# Patient Record
Sex: Male | Born: 1990 | Race: Black or African American | Hispanic: No | Marital: Single | Smoking: Never smoker
Health system: Southern US, Community
[De-identification: ages and names within clinical notes are randomized; demographics above are authoritative.]

## PROBLEM LIST (undated history)

## (undated) DIAGNOSIS — F319 Bipolar disorder, unspecified: Secondary | ICD-10-CM

## (undated) DIAGNOSIS — G4733 Obstructive sleep apnea (adult) (pediatric): Secondary | ICD-10-CM

## (undated) DIAGNOSIS — F259 Schizoaffective disorder, unspecified: Secondary | ICD-10-CM

## (undated) DIAGNOSIS — F250 Schizoaffective disorder, bipolar type: Secondary | ICD-10-CM

## (undated) HISTORY — DX: Obstructive sleep apnea (adult) (pediatric): G47.33

## (undated) HISTORY — PX: OTHER SURGICAL HISTORY: SHX169

---
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
## 2009-05-06 ENCOUNTER — Emergency Department (HOSPITAL_COMMUNITY): Admission: EM | Admit: 2009-05-06 | Discharge: 2009-05-06 | Payer: Self-pay | Admitting: Family Medicine

## 2012-08-21 ENCOUNTER — Encounter (HOSPITAL_COMMUNITY): Payer: Self-pay | Admitting: Emergency Medicine

## 2012-08-21 ENCOUNTER — Emergency Department (HOSPITAL_COMMUNITY)
Admission: EM | Admit: 2012-08-21 | Discharge: 2012-08-22 | Disposition: A | Discharge status: Home / Self Care | Payer: Medicaid Other | Attending: Emergency Medicine | Admitting: Emergency Medicine

## 2012-08-21 DIAGNOSIS — F319 Bipolar disorder, unspecified: Secondary | ICD-10-CM | POA: Insufficient documentation

## 2012-08-21 DIAGNOSIS — F209 Schizophrenia, unspecified: Secondary | ICD-10-CM

## 2012-08-21 DIAGNOSIS — Z046 Encounter for general psychiatric examination, requested by authority: Secondary | ICD-10-CM | POA: Insufficient documentation

## 2012-08-21 HISTORY — DX: Bipolar disorder, unspecified: F31.9

## 2012-08-21 LAB — COMPREHENSIVE METABOLIC PANEL
Alkaline Phosphatase: 77 U/L (ref 39–117)
BUN: 17 mg/dL (ref 6–23)
Chloride: 98 mEq/L (ref 96–112)
Creatinine, Ser: 0.91 mg/dL (ref 0.50–1.35)
GFR calc Af Amer: >90 mL/min (ref >90)
Glucose, Bld: 123 mg/dL — ABNORMAL HIGH (ref 70–99)
Potassium: 3.8 mEq/L (ref 3.5–5.1)
Total Bilirubin: 0.2 mg/dL — ABNORMAL LOW (ref 0.3–1.2)

## 2012-08-21 LAB — CBC
HCT: 45.4 % (ref 39.0–52.0)
Hemoglobin: 15.6 g/dL (ref 13.0–17.0)
MCHC: 34.4 g/dL (ref 30.0–36.0)
MCV: 83.6 fL (ref 78.0–100.0)
RDW: 12.9 % (ref 11.5–15.5)
WBC: 10.6 10*3/uL — ABNORMAL HIGH (ref 4.0–10.5)

## 2012-08-21 LAB — ETHANOL: Alcohol, Ethyl (B): <11 mg/dL (ref 0–11)

## 2012-08-21 MED ORDER — ZIPRASIDONE HCL 20 MG PO CAPS
20.0000 mg | ORAL_CAPSULE | Freq: Two times a day (BID) | ORAL | Status: DC
  Administered 2012-08-21 – 2012-08-22 (×3): 20 mg via ORAL
  Filled 2012-08-21 (×3): qty 1

## 2012-08-21 NOTE — ED Notes (Signed)
States hasn't been sleeping, feeling manic, not taking medicine for 1 day (per patient)- with 2 [employer] officers. Asking for GEODON- or something to sleep. Asked for Ambien, explained that is for night time. Speech, rapid, flight of ideas, cooperative, but says will only stay if GOEDON given, or something to help relax

## 2012-08-21 NOTE — ED Notes (Signed)
Pt to be moved to Psych ED Rm 38. Report called to Sheliah Mends ED RN. One personal belongings bag locked in [place] 38.

## 2012-08-21 NOTE — BH Assessment (Signed)
Assessment Note   Gerald Bridges is a 21 y.o. male  PRESENTING TO [facility] UNDER IVC. PT WAS IVC'D BY PSYCHIATRIST (Gerald Bridges), STATING PT HAS NOT BEEN COMPLIANT WITH MEDS, IS AGITATED, HOSTILE AND EXHIBITNG SEVERE ANXIETY.  PER PSYCHIATIST, [name] AND IS DELUSIONAL BUT DOES NOT ELABORATE ON SXS OF AVH.  PT DENIES SI/HI. THIS WRITER ATTEMPTED TO ASSESS PT, HOWEVER, PT CONTINUES TO EXHIBIT ANXIETY (UNABLE TO SIT DOWN DURING ASSESSMENT. PT TOLD THIS WRITER THAT HE DOESN'T KNOW WHY HE IS IN THE HOSPITAL OTHER THAN HIS MOTHER TELLING HIM HE HAD TO COME.  PT HAS SOME DIFFICULTY UNDERSTANDING QUESTIONS WHEN ASKED AND AT TIMES ANSWERS WITH UNINTELLIGIBLE SPEECH. PT MAY HAVE SOME COGNITIVE DISABILITIES, BUT THIS HAS NOT BEEN CONFIRMED BY THIS WRITER AS MOTHER IS UNREACHABLE(LINE IS BUSY) AT THIS TIME TO COLLABORATE STATEMENTS MADE BY PSYCHISTRIST.  PT WAS ABLE TO TELL THIS WRITER THAT HE HAS HAD INPT ADMISSIONS WITH [facility], [facility] AND [facility]. THE INFORAMTION NOTED HERE IS COLLATERAL, AS PT IS POOR HISTORIAN. [facility] REQUESTED TO COMPLETE FINAL DISPOSITION.    Axis I: Mood Disorder NOS Axis II: Deferred Axis III:  Past Medical History  Diagnosis Date  . Bipolar disorder    Axis IV: other psychosocial or environmental problems and problems related to social environment Axis V: 41-50 serious symptoms  Past Medical History:  Past Medical History  Diagnosis Date  . Bipolar disorder     History reviewed. No pertinent past surgical history.  Family History: No family history on file.  Social History:  reports that he has never smoked. He does not have any smokeless tobacco history on file. He reports that he does not drink alcohol or use illicit drugs.  Additional Social History:  Alcohol / Drug Use Pain Medications: None  Prescriptions: None  Over the Counter: None  History of alcohol / drug use?: No history of alcohol / drug abuse Longest  period of sobriety (when/how long): None   CIWA: CIWA-Ar BP: 126/70 mmHg Pulse Rate: 88  COWS:    Allergies: Not on File  Home Medications:  (Not in a hospital admission)  OB/GYN Status:  No LMP for male patient.  General Assessment Data [facility] of Assessment: [facility] Living Arrangements: Parent (Lives with mother ) Can pt return to current living arrangement?: Yes Admission Status: Involuntary Is patient capable of signing voluntary admission?: No Transfer from: Acute Hospital Referral Source: MD  Education Status Is patient currently in school?: No (Unk ) Current Grade: None  Highest grade of school patient has completed: None  Name of school: None  Contact person: None   Risk to self Suicidal Ideation: No Suicidal Intent: No Is patient at risk for suicide?: No Suicidal Plan?: No Access to Means: No What has been your use of drugs/alcohol within the last 12 months?: Pt denies  Previous Attempts/Gestures: No How many times?: 0  Other Self Harm Risks: None  Triggers for Past Attempts: None known Intentional Self Injurious Behavior: None Family Suicide History: No Recent stressful life event(s): Other (Comment) (Non compliant with meds ) Persecutory voices/beliefs?: No Depression: No Depression Symptoms:  (No depression sxs present ) Substance abuse history and/or treatment for substance abuse?: No Suicide prevention information given to non-admitted patients: Not applicable  Risk to Others Homicidal Ideation: No Thoughts of Harm to Others: No Current Homicidal Intent: No Current Homicidal Plan: No Access to Homicidal Means: No Identified Victim: None  History of  harm to others?: No Assessment of Violence: None Noted Violent Behavior Description: None  Does patient have access to weapons?: No Criminal Charges Pending?: No Does patient have a court date: No  Psychosis Hallucinations: None noted Delusions: None noted  Mental Status  Report Appear/Hygiene: Other (Comment) (Appropriate ) Eye Contact: Fair Motor Activity: Unremarkable Speech: Incoherent;Slurred Level of Consciousness: Drowsy Mood: Anxious;Irritable Affect: Anxious;Irritable Anxiety Level: Minimal Thought Processes: Irrelevant Judgement: Impaired Orientation: Person;Place;Time;Situation Obsessive Compulsive Thoughts/Behaviors: None  Cognitive Functioning Concentration: Decreased Memory: Recent Intact;Remote Intact IQ:  (Unk ) Insight: Fair Impulse Control: Fair Appetite: Good Weight Loss: 0  Weight Gain: 0  Sleep: Decreased Total Hours of Sleep:  (Intermittent ) Vegetative Symptoms: None  ADLScreening [facility] Assessment Services) Patient's cognitive ability adequate to safely complete daily activities?: Yes Patient able to express need for assistance with ADLs?: Yes Independently performs ADLs?: Yes (appropriate for developmental age)  Abuse/Neglect [facility]) Physical Abuse: Denies Verbal Abuse: Denies Sexual Abuse: Denies  Prior Inpatient Therapy Prior Inpatient Therapy: Yes Prior Therapy Dates: Unk  Prior Therapy Facilty/Provider(s): [address], [facility], [address]  Reason for Treatment: Mood D/O   Prior Outpatient Therapy Prior Outpatient Therapy: Yes Prior Therapy Dates: Current  Prior Therapy Facilty/Provider(s): [facility]  Reason for Treatment: Med Mgt   ADL Screening (condition at time of admission) Patient's cognitive ability adequate to safely complete daily activities?: Yes Patient able to express need for assistance with ADLs?: Yes Independently performs ADLs?: Yes (appropriate for developmental age) Weakness of Legs: None Weakness of Arms/Hands: None  Home Assistive Devices/Equipment Home Assistive Devices/Equipment: None  Therapy Consults (therapy consults require a physician order) PT Evaluation Needed: No OT Evalulation Needed: No SLP Evaluation Needed: No Abuse/Neglect Assessment (Assessment to be  complete while patient is alone) Physical Abuse: Denies Verbal Abuse: Denies Sexual Abuse: Denies Exploitation of patient/patient's resources: Denies Self-Neglect: Denies Values / Beliefs Cultural Requests During Hospitalization: None Spiritual Requests During Hospitalization: None Consults Spiritual Care Consult Needed: No Social Work Consult Needed: No Merchant navy officer (For Healthcare) Advance Directive: Patient does not have advance directive;Patient would not like information Pre-existing out of facility DNR order (yellow form or pink MOST form): No Nutrition Screen- [facility] Patient's home diet: Regular Have you recently lost weight without trying?: No Have you been eating poorly because of a decreased appetite?: No Malnutrition Screening Tool Score: 0   Additional Information 1:1 In Past 12 Months?: No CIRT Risk: No Elopement Risk: No Does patient have medical clearance?: Yes     Disposition:  Disposition Disposition of Patient: Referred to (Telepsych for final disposition ) Patient referred to: Other (Comment) (Telepsych for final disposition )  On Site Evaluation by:   Reviewed with Physician:     Murrell Redden 08/21/2012 10:25 PM

## 2012-08-21 NOTE — ED Notes (Signed)
Pt wanded and belonging went through by security. Items placed in locker number 2

## 2012-08-21 NOTE — ED Provider Notes (Signed)
History     CSN: [id number]  Arrival date & time 08/21/12  1028   First MD Initiated Contact with Patient 08/21/12 1044      Chief Complaint  Patient presents with  . IVC med clearance     (Consider location/radiation/quality/duration/timing/severity/associated sxs/prior treatment) HPI The patient presents under involuntary commitment.  The patient has a history of schizophrenia, according to patient's mother he has not been taking his medication, hasn't exhibited increasingly uncontrollable behavior, responding to external stimuli over the past days. The patient himself states that he is fine, denies any focal complaints.  He states that he has been unable take his medication, do to altered sleep-wake cycle.  She denies auditory, visual hallucinations.  He also denies any suicidal or homicidal ideation. Patient's mother spoke with the patient's psychiatrist prior to arrival.  In concordance IVC papers were taken out on the patient. Past Medical History  Diagnosis Date  . Bipolar disorder     History reviewed. No pertinent past surgical history.  No family history on file.  History  Substance Use Topics  . Smoking status: Never Smoker   . Smokeless tobacco: Not on file  . Alcohol Use: No      Review of Systems  Unable to perform ROS: Psychiatric disorder    Allergies  Review of patient's allergies indicates not on file.  Home Medications  No current outpatient prescriptions on file.  BP 118/77  Pulse 120  Temp 97.8 F (36.6 C) (Oral)  Resp 16  SpO2 98%  Physical Exam  Nursing note and vitals reviewed. Constitutional: He is oriented to person, place, and time. He appears well-developed. No distress.  HENT:  Head: Normocephalic and atraumatic.  Eyes: Conjunctivae normal and EOM are normal.  Cardiovascular: Normal rate and regular rhythm.   Pulmonary/Chest: Effort normal. No stridor. No respiratory distress.  Abdominal: He exhibits no distension.    Musculoskeletal: He exhibits no edema.  Neurological: He is alert and oriented to person, place, and time.  Skin: Skin is warm and dry.  Psychiatric: His mood appears anxious. His speech is rapid and/or pressured. He is is hyperactive. He is not aggressive and not combative. Thought content is delusional. Thought content is not paranoid. Cognition and memory are impaired. He expresses impulsivity. He expresses no homicidal and no suicidal ideation. He expresses no suicidal plans and no homicidal plans. He is attentive.    ED Course  Procedures (including critical care time)  Labs Reviewed  CBC - Abnormal; Notable for the following:    WBC 10.6 (*)     All other components within normal limits  URINE RAPID DRUG SCREEN (HOSP PERFORMED)  COMPREHENSIVE METABOLIC PANEL  ETHANOL  URINALYSIS, ROUTINE W REFLEX MICROSCOPIC  ACETAMINOPHEN LEVEL  SALICYLATE LEVEL   No results found.   No diagnosis found.  Patient requests Geodon due to his anxiety  MDM  This young male with history of schizophrenia now presents under involuntary commitment papers taken out by his mother, in concordance with his psychiatrist, do to medication noncompliance and increasingly anxious behavior.  On my exam the patient is fidgety, but in no distress.  He is not obviously responding to external stimuli, though he is delusional, has little insight into his condition.  He is medically clear for psychiatric evaluation.      Gerhard Munch, MD 08/21/12 [phone]

## 2012-08-21 NOTE — ED Notes (Signed)
Notifed EDP, pt. Requesting Geodon, order in for 1700.  EDP gave verbal order to give Geodon, 20mg , PO now and again at 1700.

## 2012-08-22 LAB — URINALYSIS, ROUTINE W REFLEX MICROSCOPIC
Bilirubin Urine: NEGATIVE
Ketones, ur: NEGATIVE mg/dL
Leukocytes, UA: NEGATIVE
Nitrite: NEGATIVE
Protein, ur: NEGATIVE mg/dL
Urobilinogen, UA: 0.2 mg/dL (ref 0.0–1.0)

## 2012-08-22 NOTE — ED Notes (Signed)
Pt. Continues to wait for Mom to take him home.

## 2012-08-22 NOTE — BHH Counselor (Signed)
Contacted patient's mother and she agreed to pick patient up from the ED.

## 2012-08-22 NOTE — Discharge Instructions (Signed)
 Schizophrenia Schizophrenia is a serious mental illness. There is disturbed and disorganized thinking, language, and behavior. Patients may see, hear, or feel things that are not really there. Sometimes speech is incoherent and there are multiple problems with day to day living. Schizophrenia should not be confused with multiple personality disorder (now called dissociative identity disorder) in which a person has at least two distinct personalities. About 1% of people have schizophrenia in their lifetimes. It affects men and women equally. CAUSES  There are many theories about the cause of schizophrenia. The genes a person inherits from his or her parents may be partially responsible. Stress in a person's environment can trigger episodes. A person may have functioned normally for years and then have an acute (sudden) psychotic episode caused by stress. Some scientists believe that something might happen before birth, such as a viral infection in the womb, that causes schizophrenic symptoms (problems) decades later. Special scans, such as PET (positron-emission tomography) have been used to look at the brains of people with this illness. These pictures show that some parts of the brain seem to have metabolic or chemical abnormalities. Lab studies have shown that nerve cells in some parts of the brains of schizophrenics may be uneven or damaged. Another possible cause is that chemicals carrying signals between nerve cells may not be working. Schizophrenia does not appear to be caused by family problems. Stress does appear to make things worse for people with this illness. SYMPTOMS No single symptom defines this condition. Important signs are:  Hallucinations (hearing voices or seeing things that are not there to hear or see).  Dressing inappropriately.  Neglecting personal hygiene and grooming.  Withdrawing from social contacts and not speaking to anybody (autism).  Inability to understand what a  schizophrenic is saying.  A growing distrust of people without good cause.  Being very bland or blunted emotionally (flat affect). TYPES OF SCHIZOPHRENIA  Paranoid schizophrenia involves delusional thoughts. The patient believes people around them are against them and plotting against them.  In grandiose schizophrenia the patient may feel that he is God, or the President, etc.  Disorganized schizophrenia involves symptoms of disorganized speech. TREATMENT  Medications are the most important part of the treatment of schizophrenia. Many medications are available that can relieve symptoms. It is often helpful if these can be administered by a more trusted family member because the patient may sometimes think they are being poisoned. It is important that the medications be given regularly even when the patient seems to be doing well. Do not stop giving medications without instruction by a caregiver. This could lead to a relapse. Hospitalization may sometimes be necessary if symptoms cannot be controlled with medications. Schizophrenia may be lifelong. However, periods of illness may be inter spaced with long periods of normality. Medications can greatly improve the quality of life. Non prescribed drugs and alcohol should be avoided.  Assistance is available for care. The [employer] is an organization of family members of people with severe mental illness. They direct families and patients to support groups, education, and advocacy programs for additional help. [employer]) toll-free help line number is 800/950-NAMI, or 765 632 7249. Document Released: 11/02/2000 Document Revised: 01/28/2012 Document Reviewed: 11/05/2005 [facility] Patient Information 2013 [place], [place].     Please take and comply with your current medications.

## 2012-08-22 NOTE — ED Provider Notes (Signed)
PT has had telepsych consult by Dr Jacky Kindle, who feels patient is now cooperative, but has been non-compliant with his medications. He feels patient can be discharged and has rescinded his IVC papers. He suggests keeping on his prior meds and to give geodon 20 mg IM/PO BID prn agitation. Psychosis. Feels he can be discharged in am.    Devoria Albe, MD, Franz Dell, MD 08/22/12 [phone]

## 2012-08-22 NOTE — BHH Counselor (Signed)
Patient completed a telepsych and Dr. Cathie Beams recommended discharge home with community referrals. Writer provided patient with the appropriate referrals to Mental Health, [facility], [facility] agencies, and other community psychiatrist.

## 2012-08-22 NOTE — ED Notes (Signed)
ACT team informed us that pt.'s Mother is on her way to get her son.

## 2014-02-18 DIAGNOSIS — B350 Tinea barbae and tinea capitis: Secondary | ICD-10-CM | POA: Insufficient documentation

## 2015-05-03 ENCOUNTER — Encounter: Payer: Self-pay | Admitting: *Deleted

## 2015-05-03 ENCOUNTER — Encounter: Payer: Medicaid Other | Attending: Family Medicine | Admitting: *Deleted

## 2015-05-03 DIAGNOSIS — Z713 Dietary counseling and surveillance: Secondary | ICD-10-CM | POA: Insufficient documentation

## 2015-05-03 DIAGNOSIS — E669 Obesity, unspecified: Secondary | ICD-10-CM | POA: Insufficient documentation

## 2015-05-03 NOTE — Progress Notes (Signed)
  Medical Nutrition Therapy:  Appt start time: 0900 end time:  0930.  Assessment:  Primary concerns today: Gerald Bridges is here with his mom for nutrition counseling.  Patient has schizoaffective disorder and lives with his mom.  Mom reports requesting this appointment as she is concerned about his physical health and nutrition.  She feels she is a healthy eater and tries to have healthier food choices in the home, but he won't eat them.  She says he drinks soda all the time and loves energy-dense foods. She thinks he has an eating disorder because he eats all the time and she has tried multiple different ways to get him to make changes.    Mom does the grocery shopping and the cooking, but Gerald Bridges gets an allowance and buys his own sodas and snacks.  Mom has diabetes so she doesn't fry foods anymore.  Mom reports that Gerald Bridges only wants the meat, not the vegetables.  He also drinks several liters of soda and she switched him to juice. When at home he eats in his bed without distractions.  He is a fast eater.  They do not eat out often. He loves fried chicken and macaroni, hamburger, fries, cakes, soda.   Preferred Learning Style:  No preference indicated   Learning Readiness:  Contemplating- Gerald Bridges  Ready- mom  MEDICATIONS: see list   DIETARY INTAKE:  Usual eating pattern includes 1 meals and 0 snacks per day. Per Gerald Bridges.  Mom reports that he eats constantly, all day and is constantly hungry  Everyday foods include proteins, sugary beverages, and starches.  Avoided foods include vegetables.    24-hr recall:  B ( AM): grits, eggs, sausage  D ( PM): chinese food Beverages: pepsi, sprite, fruit lunch, juice  Usual physical activity: none.   [facility] but [facility] in [facility]  Estimated energy needs: 2500-2800 calories    Nutritional Diagnosis:  NB-1.7 Undesireable food choices As related to excessive consumption of sugary beverages and energy dense snacks, as well as limited  consumption of fruits orvegtables.  As evidenced by dietary recall.    Intervention:  Nutrition counseling provided.  Briefly discussed Gerald Bridges's risk for diabetes due to family history.  Explained that diabetes was preventable, or at least delayable, if he makes some changes.  He responded he was interested in making changes.  Recommended decreasing sodas and increasing water.  He drinks 2 2-liter Pepsi's/day.  He agreed to drink 1/day instead.  Discussed family meals and he agreed to eat at the table in the kitchen with his mom.  Asked if he could taste the vegetables mom prepares and he agreed to try vegetables.  Discussed eating more slowly, aim to make meals last 20 minutes in order to register satiety. Used MyPlate to show what healthy eating looks like.  Asked mom to limit the funds he is allowed for buying snacks and to please now buy as much herself  Teaching Method Utilized:  Visual Auditory   Barriers to learning/adherence to lifestyle change: willingness to change  Demonstrated degree of understanding via:  Teach Back   Monitoring/Evaluation:  Dietary intake, exercise, and body weight in 1 month(s).

## 2015-05-03 NOTE — Patient Instructions (Signed)
Eat at the table with mom in the kitchen Cut back on soda, increase water Taste the vegetables please Eat more slowly, try to make meal last 20 minutes

## 2015-06-09 ENCOUNTER — Ambulatory Visit: Payer: Medicaid Other | Admitting: *Deleted

## 2015-09-02 ENCOUNTER — Other Ambulatory Visit: Payer: Self-pay | Admitting: Family

## 2015-09-02 DIAGNOSIS — M7989 Other specified soft tissue disorders: Secondary | ICD-10-CM

## 2015-09-06 ENCOUNTER — Ambulatory Visit
Admission: RE | Admit: 2015-09-06 | Discharge: 2015-09-06 | Disposition: A | Discharge status: Home / Self Care | Payer: Medicaid Other | Source: Ambulatory Visit | Attending: Family | Admitting: Family

## 2015-09-06 DIAGNOSIS — M7989 Other specified soft tissue disorders: Secondary | ICD-10-CM

## 2015-10-09 ENCOUNTER — Encounter (HOSPITAL_COMMUNITY): Payer: Self-pay | Admitting: *Deleted

## 2015-10-09 ENCOUNTER — Emergency Department (HOSPITAL_COMMUNITY)
Admission: EM | Admit: 2015-10-09 | Discharge: 2015-10-11 | Disposition: A | Discharge status: Home / Self Care | Payer: Medicaid Other | Attending: Emergency Medicine | Admitting: Emergency Medicine

## 2015-10-09 DIAGNOSIS — F259 Schizoaffective disorder, unspecified: Secondary | ICD-10-CM | POA: Diagnosis not present

## 2015-10-09 DIAGNOSIS — R4789 Other speech disturbances: Secondary | ICD-10-CM | POA: Diagnosis not present

## 2015-10-09 DIAGNOSIS — F250 Schizoaffective disorder, bipolar type: Secondary | ICD-10-CM | POA: Diagnosis not present

## 2015-10-09 DIAGNOSIS — Z8659 Personal history of other mental and behavioral disorders: Secondary | ICD-10-CM | POA: Diagnosis not present

## 2015-10-09 DIAGNOSIS — K029 Dental caries, unspecified: Secondary | ICD-10-CM | POA: Diagnosis not present

## 2015-10-09 DIAGNOSIS — R531 Weakness: Secondary | ICD-10-CM | POA: Diagnosis present

## 2015-10-09 DIAGNOSIS — Z79899 Other long term (current) drug therapy: Secondary | ICD-10-CM | POA: Insufficient documentation

## 2015-10-09 DIAGNOSIS — F251 Schizoaffective disorder, depressive type: Secondary | ICD-10-CM | POA: Diagnosis not present

## 2015-10-09 DIAGNOSIS — R479 Unspecified speech disturbances: Secondary | ICD-10-CM

## 2015-10-09 HISTORY — DX: Schizoaffective disorder, bipolar type: F25.0

## 2015-10-09 HISTORY — DX: Schizoaffective disorder, unspecified: F25.9

## 2015-10-09 LAB — URINALYSIS, ROUTINE W REFLEX MICROSCOPIC
Bilirubin Urine: NEGATIVE
Glucose, UA: NEGATIVE mg/dL
Hgb urine dipstick: NEGATIVE
Ketones, ur: NEGATIVE mg/dL
LEUKOCYTES UA: NEGATIVE
NITRITE: NEGATIVE
PROTEIN: NEGATIVE mg/dL
Specific Gravity, Urine: 1.021 (ref 1.005–1.030)
pH: 5.5 (ref 5.0–8.0)

## 2015-10-09 MED ORDER — DIPHENHYDRAMINE HCL 25 MG PO CAPS
25.0000 mg | ORAL_CAPSULE | Freq: Once | ORAL | Status: AC
  Administered 2015-10-09: 25 mg via ORAL
  Filled 2015-10-09: qty 1

## 2015-10-09 NOTE — ED Provider Notes (Signed)
CSN: [id number]     Arrival date & time 10/09/15  2220 History   First MD Initiated Contact with Patient 10/09/15 2240     Chief Complaint  Patient presents with  . Weakness  . Aphasia     (Consider location/radiation/quality/duration/timing/severity/associated sxs/prior Treatment) HPI   Gerald Bridges is a 24 y.o. male, with a history of Bipolar and Schizoaffective Schizophrenia, presenting to the ED with mumbling speech. Last seen normal was one week ago. Only medication change was the addition of PO Haldol about a week ago. Patient's mother states, "I think he may have had a stroke." Pt denies pain, fever/chills, N/V, dizziness, recent illness, or any other complaints. Pt [facility] to [facility] his prescriptions. Most of the history of present illness was provided by patient's mother and other family at the bedside. Patient's mother states, "something's changed about his voice I don't usually have to strain to hear him or understand him." Accounts vary as to when the symptoms began because mother states that his last seen normal was 2 days ago, however other family members state the patient hasn't been normal for a week.   Past Medical History  Diagnosis Date  . Bipolar disorder (HCC)   . Schizo affective schizophrenia (HCC)    History reviewed. No pertinent past surgical history. Family History  Problem Relation Age of Onset  . Diabetes Mother    Social History  Substance Use Topics  . Smoking status: Never Smoker   . Smokeless tobacco: None  . Alcohol Use: No    Review of Systems  Neurological: Positive for speech difficulty.  All other systems reviewed and are negative.     Allergies  Dexmethylphenidate hcl and Methylphenidate derivatives  Home Medications   Prior to Admission medications   Medication Sig Start Date End Date Taking? Authorizing Provider  benztropine (COGENTIN) 2 MG tablet Take 2 mg by mouth 2 (two) times daily.   Yes Historical Provider, MD   haloperidol (HALDOL) 1 MG tablet Take 1 mg by mouth 2 (two) times daily.   Yes Historical Provider, MD  haloperidol decanoate (HALDOL DECANOATE) 100 MG/ML injection Inject 100 mg into the muscle every 28 (twenty-eight) days.   Yes Historical Provider, MD   BP 128/91 mmHg  Pulse 95  Temp(Src) 97.5 F (36.4 C) (Oral)  Resp 19  SpO2 98% Physical Exam  Constitutional: He is oriented to person, place, and time. He appears well-developed and well-nourished. No distress.  HENT:  Head: Normocephalic and atraumatic.  Speech seems as though pt is mumbling. Dental caries noted in right lower pre-molars. Tongue does not appear swollen. Oropharynx appears dry. Upon inspection only the patient's hard palate and part of the soft palate is visible.  Eyes: Conjunctivae are normal. Pupils are equal, round, and reactive to light.  Neck: Normal range of motion. Neck supple.  Cardiovascular: Normal rate, regular rhythm and normal heart sounds.   Pulmonary/Chest: Effort normal and breath sounds normal. No respiratory distress.  Abdominal: Soft. Bowel sounds are normal.  Musculoskeletal: Normal range of motion. He exhibits no edema or tenderness.  Lymphadenopathy:    He has no cervical adenopathy.  Neurological: He is alert and oriented to person, place, and time. He has normal reflexes.  No sensory deficits. Strength 5/5 in all extremities. Shuffling gait, which family states has been going on for weeks. Cranial nerves II-XII grossly intact. Grip strengths equal. No arm drift. No facial droop.   Skin: Skin is warm and dry. He is not diaphoretic.  Psychiatric:  Affect is flattened.   Nursing note and vitals reviewed.   ED Course  Procedures (including critical care time) Labs Review Labs Reviewed  CBC WITH DIFFERENTIAL/PLATELET - Abnormal; Notable for the following:    WBC 12.9 (*)    Neutro Abs 9.0 (*)    Monocytes Absolute 1.6 (*)    All other components within normal limits  COMPREHENSIVE  METABOLIC PANEL - Abnormal; Notable for the following:    Sodium 133 (*)    Glucose, Bld 108 (*)    Total Protein 8.5 (*)    ALT 16 (*)    All other components within normal limits  URINALYSIS, ROUTINE W REFLEX MICROSCOPIC (NOT AT [facility]) - Abnormal; Notable for the following:    APPearance CLOUDY (*)    All other components within normal limits  URINE RAPID DRUG SCREEN, HOSP PERFORMED    Imaging Review Ct Head Wo Contrast  10/10/2015  CLINICAL DATA:  Acute onset of slurred speech and abnormal gait. Initial encounter. EXAM: CT HEAD WITHOUT CONTRAST TECHNIQUE: Contiguous axial images were obtained from the base of the skull through the vertex without intravenous contrast. COMPARISON:  None. FINDINGS: There is no evidence of acute infarction, mass lesion, or intra- or extra-axial hemorrhage on CT. The posterior fossa, including the cerebellum, brainstem and fourth ventricle, is within normal limits. The third and lateral ventricles, and basal ganglia are unremarkable in appearance. The cerebral hemispheres are symmetric in appearance, with normal gray-white differentiation. No mass effect or midline shift is seen. There is no evidence of fracture; there is incomplete fusion of the posterior arch of C1. The orbits are within normal limits. The paranasal sinuses and mastoid air cells are well-aerated. No significant soft tissue abnormalities are seen. IMPRESSION: Unremarkable noncontrast CT of the head. Electronically Signed   By: Roanna RaiderJeffery  Chang M.D.   On: 10/10/2015 00:25   I have personally reviewed and evaluated these images and lab results as part of my medical decision-making.   EKG Interpretation None      MDM   Final diagnoses:  Speech abnormality  Dental caries    Gerald Bridges presents with changes in speech.  Findings and plan of care discussed with Geoffery Lyonsouglas Delo, MD.  Suspect that patient has dry mouth from the Haldol combined with discomfort from dental caries. This patient  does not present as if he's had a stroke. 12:04 AM reassessed patient. No change in patient speech pattern. No additional symptoms or complaints. Head CT reveals no intracranial hemorrhage or other abnormalities. Suspect this patient may need an adjustment in his medications. Will consult TTS to evaluate if patient needs to be admitted for medication adjustment. This plan of care as well as the CT scan and lab results were communicated with the patient and his family, who agreed with the plan and voice understanding of the CT scan and lab results. 12:43 AM spoke with TTS and gave them a rundown of this patient. They confirmed that they would evaluate this patient. 12:49 AM End of shift patient care handoff report given to Earley FavorGail Schulz, NP Filed Vitals:   10/09/15 2226 10/09/15 2300 10/10/15 0000  BP: 126/86 130/91 128/91  Pulse: 94 100 95  Temp: 97.5 F (36.4 C)    TempSrc: Oral    Resp: 18 15 19   SpO2: 97% 99% 98%    Anselm PancoastShawn C Nayelis Bonito, PA-C 10/10/15 0050  Geoffery Lyonsouglas Delo, MD 10/10/15 [id number]  Geoffery Lyonsouglas Delo, MD 10/10/15 [id number]

## 2015-10-09 NOTE — ED Notes (Signed)
Family states "I think he has had a stroke"; family reports gait is off and pt has generalized weakness; family reports slurred speech and trouble getting words out; normal facial movement and expression; no facial droop noted; pt ambulatory to triage with a shortened gait; pt c/o of feeling short of breath; no increase work of breathing or accessory muscle use noted

## 2015-10-10 ENCOUNTER — Emergency Department (HOSPITAL_COMMUNITY): Payer: Medicaid Other

## 2015-10-10 DIAGNOSIS — F259 Schizoaffective disorder, unspecified: Secondary | ICD-10-CM | POA: Diagnosis not present

## 2015-10-10 LAB — COMPREHENSIVE METABOLIC PANEL
ALK PHOS: 72 U/L (ref 38–126)
ALT: 16 U/L — AB (ref 17–63)
AST: 19 U/L (ref 15–41)
Albumin: 4.4 g/dL (ref 3.5–5.0)
Anion gap: 7 (ref 5–15)
BUN: 13 mg/dL (ref 6–20)
CALCIUM: 9.5 mg/dL (ref 8.9–10.3)
CO2: 24 mmol/L (ref 22–32)
CREATININE: 0.87 mg/dL (ref 0.61–1.24)
Chloride: 102 mmol/L (ref 101–111)
Glucose, Bld: 108 mg/dL — ABNORMAL HIGH (ref 65–99)
Potassium: 3.8 mmol/L (ref 3.5–5.1)
Sodium: 133 mmol/L — ABNORMAL LOW (ref 135–145)
Total Bilirubin: 0.3 mg/dL (ref 0.3–1.2)
Total Protein: 8.5 g/dL — ABNORMAL HIGH (ref 6.5–8.1)

## 2015-10-10 LAB — CBC WITH DIFFERENTIAL/PLATELET
Basophils Absolute: 0 10*3/uL (ref 0.0–0.1)
Basophils Relative: 0 %
Eosinophils Absolute: 0.1 10*3/uL (ref 0.0–0.7)
Eosinophils Relative: 1 %
HCT: 39.3 % (ref 39.0–52.0)
HEMOGLOBIN: 13.3 g/dL (ref 13.0–17.0)
LYMPHS ABS: 2.2 10*3/uL (ref 0.7–4.0)
LYMPHS PCT: 17 %
MCH: 28.5 pg (ref 26.0–34.0)
MCHC: 33.8 g/dL (ref 30.0–36.0)
MCV: 84.2 fL (ref 78.0–100.0)
Monocytes Absolute: 1.6 10*3/uL — ABNORMAL HIGH (ref 0.1–1.0)
Monocytes Relative: 13 %
NEUTROS PCT: 69 %
Neutro Abs: 9 10*3/uL — ABNORMAL HIGH (ref 1.7–7.7)
Platelets: 337 10*3/uL (ref 150–400)
RBC: 4.67 MIL/uL (ref 4.22–5.81)
RDW: 13.9 % (ref 11.5–15.5)
WBC: 12.9 10*3/uL — AB (ref 4.0–10.5)

## 2015-10-10 LAB — RAPID URINE DRUG SCREEN, HOSP PERFORMED
Amphetamines: NOT DETECTED
BARBITURATES: NOT DETECTED
Benzodiazepines: NOT DETECTED
Cocaine: NOT DETECTED
Opiates: NOT DETECTED
Tetrahydrocannabinol: NOT DETECTED

## 2015-10-10 MED ORDER — HALOPERIDOL 1 MG PO TABS
1.0000 mg | ORAL_TABLET | Freq: Two times a day (BID) | ORAL | Status: DC
  Administered 2015-10-10 – 2015-10-11 (×3): 1 mg via ORAL
  Filled 2015-10-10 (×3): qty 1

## 2015-10-10 MED ORDER — BENZTROPINE MESYLATE 1 MG PO TABS: 2.0000 mg | ORAL_TABLET | Freq: Two times a day (BID) | ORAL | Status: DC

## 2015-10-10 MED ORDER — AMANTADINE HCL 100 MG PO CAPS
100.0000 mg | ORAL_CAPSULE | Freq: Two times a day (BID) | ORAL | Status: DC
  Administered 2015-10-10 – 2015-10-11 (×3): 100 mg via ORAL
  Filled 2015-10-10 (×5): qty 1

## 2015-10-10 MED ORDER — BENZTROPINE MESYLATE 1 MG PO TABS: 1.0000 mg | ORAL_TABLET | Freq: Two times a day (BID) | ORAL | Status: DC

## 2015-10-10 NOTE — Discharge Instructions (Signed)
You have been seen today for a change in your speech. Your imaging showed no abnormalities. Follow up with PCP as needed. Return to ED should symptoms worsen. It is recommended that you follow up with a dentist as soon as possible. Resource guide is attached to help you choose a dentist should you need it.   Emergency Department Resource Guide 1) Find a Doctor and Pay Out of Pocket Although you won't have to find out who is covered by your insurance plan, it is a good idea to ask around and get recommendations. You will then need to call the office and see if the doctor you have chosen will accept you as a new patient and what types of options they offer for patients who are self-pay. Some doctors offer discounts or will set up payment plans for their patients who do not have insurance, but you will need to ask so you aren't surprised when you get to your appointment.  2) Contact Your Local Health Department Not all health departments have doctors that can see patients for sick visits, but many do, so it is worth a call to see if yours does. If you don't know where your local health department is, you can check in your phone book. The CDC also has a tool to help you locate your state's health department, and many state websites also have listings of all of their local health departments.  3) Find a Walk-in Clinic If your illness is not likely to be very severe or complicated, you may want to try a walk in clinic. These are popping up all over the country in pharmacies, drugstores, and shopping centers. They're usually staffed by nurse practitioners or physician assistants that have been trained to treat common illnesses and complaints. They're usually fairly quick and inexpensive. However, if you have serious medical issues or chronic medical problems, these are probably not your best option.  No Primary Care Doctor: - Call Health Connect at  [phone] - they can help you locate a primary care doctor  that  accepts your insurance, provides certain services, etc. - Physician Referral Service- [phone]  Chronic Pain Problems: Organization         Address  Phone   Notes  [facility]  [phone] Patients need to be referred by their primary care doctor.   Medication Assistance: Organization         Address  Phone   Notes  [facility] Medication [facility] [address] [place] [place], [place] [address] [phone] --Must be a resident of [facility] -- Must have NO insurance coverage whatsoever (no Medicaid/ Medicare, etc.) -- The pt. MUST have a primary care doctor that directs their care regularly and follows them in the community   MedAssist  [phone]   [employer]  [phone]    Agencies that provide inexpensive medical care: Organization         Address  Phone   Notes  [facility]  [phone]   [facility]    [phone]   [facility] [address] [place], [place] [address] [phone]   [facility] [address] [place] [place] [phone]   Planned Parenthood    [phone]   [facility]    [phone]   [facility]  [address], [place] Phone:  [phone], Fax:  [phone])  [phone] Hours of Operation:  9 am - 6 pm, M-F.  Also accepts Medicaid/Medicare and self-pay.  [facility]  [address], [place] Phone: [phone]) [phone], Fax: [phone]) [phone]. Hours of Operation:  8:30 am - 5:30 pm, M-F.  Also accepts Medicaid and self-pay.  [facility] [place] [address], [place] Phone: [phone](336) [phone]   [facility] [address], [place], [place] [phone], Ext. [phone] Mondays & Thursdays: 7-9 AM.  First 15 patients are seen on a first come, first serve basis.    Medicaid-accepting [facility] Providers:  Organization          Address  Phone   Notes  [facility] [address], [place] [phone]) [phone] Also accepts self-pay patients.  [facility] [address], [place]  [phone](336) [phone]   [facility] [address], [place] [phone]) [phone]   [facility] [address] [place] [address], [place] [phone](336) [phone]   Renaye RakersVeita Bland [address], [place]   [phone](336) [phone] Only accepts [place] Access [place]icaid patients after they have their name applied to their card.   Self-Pay (no insurance) in [facility] [place]:  Organization         Address  Phone   Notes  Sickle Cell Patients, [facility] [address], [place] [phone]) [phone]   [facility] [address], [place] [phone]) [phone]   [facility]  [address], [place] [phone]) [phone]   [facility]/Dr. Osei-Bonsu  [address] [place] [address], [place] or [phone], [place] [phone](336) [phone] Phone number for both [place] and [place] locations is the same.  [facility] [address], [place] [phone]) [phone]   [facility] [address] [place] [address], [place] or [address] [phone](336) [phone]   [facility] [address], [place] [phone](336) [phone], phone; [phone](336) [phone], fax Sees patients 1st and 3rd Saturday of every month.  Must not qualify for public or private insurance (i.e. Medicaid, Medicare,  [place] Health Choice, Veterans' Benefits)  Household income should be no more than 200% of the poverty level The clinic cannot treat you if you are pregnant or think you are pregnant  Sexually transmitted diseases are not treated at the clinic.    Dental Care: Organization         Address  Phone  Notes  [facility] of [facility] [address], [place] [phone]) [phone] Accepts children up to age 25 who are enrolled in [place]icaid or [place] Health Choice; pregnant women with a Medicaid card; and children who have applied for Medicaid or [place] Health Choice, but were declined, whose parents can pay a reduced fee at time of service.  [facility] of [facility] [place]  [address], [place] [phone]) [phone] Accepts children up to age 25 who are enrolled in [place]icaid or [place] Health Choice; pregnant women with a Medicaid card; and children who have applied for Medicaid or  Health Choice, but were declined, whose parents can pay a reduced fee at time of service.  [facility]  [address], [place] [phone]) [phone] Patients are seen by appointment only. Walk-ins are not accepted. [facility]  will see patients 25 years of age and older. Monday - Tuesday (8am-5pm) Most Wednesdays (8:30-5pm) $30 per visit, cash only  [facility]  [address], [facility] [phone] Patients are seen by appointment only. Walk-ins are not accepted. [facility] will see patients 25 years of age and older. One Wednesday Evening (Monthly: Volunteer Based).  $30 per visit, cash only  [employer] [facility]  [phone]) [phone] for adults; Children under age 724, call Graduate Pediatric Dentistry at [phone]. Children aged 654-14, please call [phone]) [phone] to request a pediatric application.  Dental services are provided in all areas of dental care including fillings, crowns and bridges, complete and partial dentures, implants, gum treatment, root canals, and extractions. Preventive care is also provided. Treatment is provided to both adults and children. Patients are selected via a lottery and there is often a waiting list.   [facility] [address], [place]  [phone] [email or web address]   [facility] [address], [place], [place]  [phone], Ext. [phone] Second and Fourth Thursday of each month, opens at 6:30 AM; Clinic ends at 9 AM.  Patients are seen on a first-come first-served basis, and a limited number are seen during each clinic.   [facility]  [address], [place], [place] [phone]) [phone]   Eligibility Requirements You must have lived in [place], [place], or [place] counties for at least the last three months.   You cannot be eligible for state or federal sponsored National Cityhealthcare insurance, including CIGNAVeterans Administration, [place]icaid, or [employer]Medicare.   You generally cannot be eligible for healthcare insurance through your employer.    How to apply: Eligibility screenings are held every Tuesday and Wednesday afternoon from 1:00 pm until 4:00 pm. You do not need an appointment for the interview!  [facility] [address] [place] [address], [place], [place] [phone]   [facility]  [phone]   [facility]  [phone]   [facility]  [phone]    Behavioral Health Resources in the Community: Intensive Outpatient Programs Organization         Address  Phone  Notes  [facility] [address], [place], [place] [phone]   [facility] [address], [place], [place] [phone]   [facility] [address], [place], [place]  [phone]   [facility] [address],  [place], [place] [phone] or [phone]   Substance Abuse Resources Organization         Address  Phone  Notes  [facility]  [phone]   [facility]  [phone]   [facility]  [phone]   [facility]  [phone]   Residential & Outpatient Substance Abuse Program  [phone]   Psychological Services Organization         Address  Phone  Notes  [facility]  [phone]   [facility]  [phone]- [phone]    [facility] [address], [place] [phone] or [phone]    Mobile Crisis Teams Organization         Address  Phone  Notes  [facility], Mobile Crisis Care Unit  [phone]   [facility]  [address] [place], [place] [phone]   [facility] De[facility] [address] [employer] at [place] [phone]    Self-Help/Support Groups Organization  Address  Phone             Notes  [place] - variety of support groups  [place] Call for more information  Narcotics Anonymous (NA), [facility] [address], [employer] [place]  2 meetings at this location   Special educational needs teacher         Address  Phone  Notes  [facility]idential Treatment [place],    [place]  [phone]   [facility]  [address] [place] [address], [place], [place]   [place], [place] [phone] Admissions: 8am-3pm M-F  Incentives Substance [place] [address],    [place], [place] [phone]   [facility] [address] [place], [place], [place]   [facility] [address]  [place], [place]   Insight Programs - Intensive Outpatient [address], [place], [place]   [facility] ([place] [place].,  [place], [place] [phone] or [phone]   [facility] Services (RTS) [address] [place], [place] Accepts Medicaid  [facility] [place] [address]  [place] [phone] Substance Abuse/Addiction Treatment   [facility] Organization         Address  Phone  Notes  [facility]  [phone]   Domenic Schwab, PhD [address] [place], [place]   [phone] or [phone]   [place]  [address] [place], [place] [phone]   [facility] [address] [place], [place] [phone] Insurance/Medicaid/sponsorship through [facility] [address]                                    [place], [place] [phone] [place] [address], [place] [phone]    Dr. Adele Schilder  [phone]   [facility] Dept. 1) [address], [place] 2) [place] 3)  [address], [place] [phone]  [phone]   [place] [phone] or [phone] (After Hours)

## 2015-10-10 NOTE — ED Notes (Signed)
Patient belongings taken home by family

## 2015-10-10 NOTE — BH Assessment (Signed)
Assessment completed. Consulted Hulan FessIjeoma Nwaeze, NP who recommended inpatient treatment. TTS to seek placement. Informed Elsie StainGail Shultz, NP of recommendation.

## 2015-10-10 NOTE — Consult Note (Signed)
[facility] Consult   Reason for Consult:  Slurred speech, facial droop, shortened gait Referring Physician:  EDP Patient Identification: Gerald Bridges MRN:  [id number] Principal Diagnosis: Schizoaffective disorder [facility]) Diagnosis:   Patient Active Problem List   Diagnosis Date Noted  . Schizoaffective disorder ([place]) [F25.9] 10/10/2015    Priority: High    Total Time spent with patient: 45 minutes  Subjective:   Gerald Bridges is a 24 y.o. male patient will be observed overnight for stabilization.  HPI:  24 yo male who presented to the ED with facail droop, shortened gait, slurred speech, and weakness.  History of schizophrenia with the new start of Haldol decanoate injection prior to symptoms started.  Medications adjusted and symptoms are dissipating.  He is still has some psychomotor retardation.  Denies suicidal/homicidal ideations, hallucinations, and alcohol/drug abuse.  His medications adjusted and monitored over night.  Past Psychiatric History: Schizoaffective disorder  Risk to Self: Suicidal Ideation: No Suicidal Intent: No Is patient at risk for suicide?: No Suicidal Plan?: No Access to Means: No What has been your use of drugs/alcohol within the last 12 months?: No alcohol or drug use.  How many times?: 0 Other Self Harm Risks: No  Triggers for Past Attempts: None known Intentional Self Injurious Behavior: None Risk to Others: Homicidal Ideation: No Thoughts of Harm to Others: No Current Homicidal Intent: No Current Homicidal Plan: No Access to Homicidal Means: No Identified Victim: N/A History of harm to others?: No Assessment of Violence: On admission Violent Behavior Description: No violent behaviors observed. Pt is calm and cooperative at this time.  Does patient have access to weapons?: No Criminal Charges Pending?: No Does patient have a court date: No Prior Inpatient Therapy: Prior Inpatient Therapy: Yes Prior Therapy Dates:  2010 Prior Therapy Facilty/Provider(s): [facility]  Reason for Treatment: Schizophrenia  Prior Outpatient Therapy: Prior Outpatient Therapy: Yes Prior Therapy Dates: 2010-Current  Prior Therapy Facilty/Provider(s): [facility]  Reason for Treatment: Medication management  Does patient have an ACCT team?: No Does patient have Intensive In-House Services?  : No Does patient have [facility] services? : Yes Does patient have P4CC services?: No  Past Medical History:  Past Medical History  Diagnosis Date  . Bipolar disorder ([place])   . Schizo affective schizophrenia ([place])    History reviewed. No pertinent past surgical history. Family History:  Family History  Problem Relation Age of Onset  . Diabetes Mother    Family Psychiatric  History: Unknown  Social History:  History  Alcohol Use No     History  Drug Use No    Social History   Social History  . Marital Status: Single    Spouse Name: N/A  . Number of Children: N/A  . Years of Education: N/A   Social History Main Topics  . Smoking status: Never Smoker   . Smokeless tobacco: None  . Alcohol Use: No  . Drug Use: No  . Sexual Activity: Not Asked   Other Topics Concern  . None   Social History Narrative   Additional Social History:    History of alcohol / drug use?: No history of alcohol / drug abuse                     Allergies:   Allergies  Allergen Reactions  . Dexmethylphenidate Hcl     Violent.  . Methylphenidate Derivatives     Violent.    Labs:  Results for orders placed or performed  during the hospital encounter of 10/09/15 (from the past 48 hour(s))  Urine rapid drug screen (hosp performed)     Status: None   Collection Time: 10/09/15 11:43 PM  Result Value Ref Range   Opiates NONE DETECTED NONE DETECTED   Cocaine NONE DETECTED NONE DETECTED   Benzodiazepines NONE DETECTED NONE DETECTED   Amphetamines NONE DETECTED NONE DETECTED   Tetrahydrocannabinol NONE DETECTED NONE DETECTED    Barbiturates NONE DETECTED NONE DETECTED    Comment:        DRUG SCREEN FOR MEDICAL PURPOSES ONLY.  IF CONFIRMATION IS NEEDED FOR ANY PURPOSE, NOTIFY LAB WITHIN 5 DAYS.        LOWEST DETECTABLE LIMITS FOR URINE DRUG SCREEN Drug Class       Cutoff (ng/mL) Amphetamine      1000 Barbiturate      200 Benzodiazepine   381 Tricyclics       829 Opiates          300 Cocaine          300 THC              50   Urinalysis, Routine w reflex microscopic (not at [facility])     Status: Abnormal   Collection Time: 10/09/15 11:43 PM  Result Value Ref Range   Color, Urine YELLOW YELLOW   APPearance CLOUDY (A) CLEAR   Specific Gravity, Urine 1.021 1.005 - 1.030   pH 5.5 5.0 - 8.0   Glucose, UA NEGATIVE NEGATIVE mg/dL   Hgb urine dipstick NEGATIVE NEGATIVE   Bilirubin Urine NEGATIVE NEGATIVE   Ketones, ur NEGATIVE NEGATIVE mg/dL   Protein, ur NEGATIVE NEGATIVE mg/dL   Nitrite NEGATIVE NEGATIVE   Leukocytes, UA NEGATIVE NEGATIVE    Comment: MICROSCOPIC NOT DONE ON URINES WITH NEGATIVE PROTEIN, BLOOD, LEUKOCYTES, NITRITE, OR GLUCOSE <1000 mg/dL.  CBC with Differential     Status: Abnormal   Collection Time: 10/09/15 11:58 PM  Result Value Ref Range   WBC 12.9 (H) 4.0 - 10.5 K/uL   RBC 4.67 4.22 - 5.81 MIL/uL   Hemoglobin 13.3 13.0 - 17.0 g/dL   HCT 39.3 39.0 - 52.0 %   MCV 84.2 78.0 - 100.0 fL   MCH 28.5 26.0 - 34.0 pg   MCHC 33.8 30.0 - 36.0 g/dL   RDW 13.9 11.5 - 15.5 %   Platelets 337 150 - 400 K/uL   Neutrophils Relative % 69 %   Neutro Abs 9.0 (H) 1.7 - 7.7 K/uL   Lymphocytes Relative 17 %   Lymphs Abs 2.2 0.7 - 4.0 K/uL   Monocytes Relative 13 %   Monocytes Absolute 1.6 (H) 0.1 - 1.0 K/uL   Eosinophils Relative 1 %   Eosinophils Absolute 0.1 0.0 - 0.7 K/uL   Basophils Relative 0 %   Basophils Absolute 0.0 0.0 - 0.1 K/uL  Comprehensive metabolic panel     Status: Abnormal   Collection Time: 10/09/15 11:58 PM  Result Value Ref Range   Sodium 133 (L) 135 - 145 mmol/L   Potassium  3.8 3.5 - 5.1 mmol/L   Chloride 102 101 - 111 mmol/L   CO2 24 22 - 32 mmol/L   Glucose, Bld 108 (H) 65 - 99 mg/dL   BUN 13 6 - 20 mg/dL   Creatinine, Ser 0.87 0.61 - 1.24 mg/dL   Calcium 9.5 8.9 - 10.3 mg/dL   Total Protein 8.5 (H) 6.5 - 8.1 g/dL   Albumin 4.4 3.5 - 5.0 g/dL  AST 19 15 - 41 U/L   ALT 16 (L) 17 - 63 U/L   Alkaline Phosphatase 72 38 - 126 U/L   Total Bilirubin 0.3 0.3 - 1.2 mg/dL   GFR calc non Af Amer >60 >60 mL/min   GFR calc Af Amer >60 >60 mL/min    Comment: (NOTE) The eGFR has been calculated using the CKD EPI equation. This calculation has not been validated in all clinical situations. eGFR's persistently <60 mL/min signify possible Chronic Kidney Disease.    Anion gap 7 5 - 15    Current Facility-Administered Medications  Medication Dose Route Frequency Provider Last Rate Last Dose  . amantadine (SYMMETREL) capsule 100 mg  100 mg Oral BID Briggette Najarian   100 mg at 10/10/15 1100  . haloperidol (HALDOL) tablet 1 mg  1 mg Oral BID Junius Creamer, NP   1 mg at 10/10/15 1036   Current Outpatient Prescriptions  Medication Sig Dispense Refill  . benztropine (COGENTIN) 2 MG tablet Take 2 mg by mouth 2 (two) times daily.    . haloperidol (HALDOL) 1 MG tablet Take 1 mg by mouth 2 (two) times daily.    . haloperidol decanoate (HALDOL DECANOATE) 100 MG/ML injection Inject 100 mg into the muscle every 28 (twenty-eight) days.      Musculoskeletal: Strength & Muscle Tone: within normal limits Gait & Station: normal Patient leans: N/A  Psychiatric Specialty Exam: Review of Systems  Constitutional: Negative.   HENT: Negative.   Eyes: Negative.   Respiratory: Negative.   Cardiovascular: Negative.   Gastrointestinal: Negative.   Genitourinary: Negative.   Musculoskeletal: Negative.   Skin: Negative.   Neurological: Negative.   Endo/Heme/Allergies: Negative.   Psychiatric/Behavioral:       EPS side effects    Blood pressure 134/80, pulse 93, temperature 98.2  F (36.8 C), temperature source Oral, resp. rate 20, SpO2 99 %.There is no height or weight on file to calculate BMI.  General Appearance: Casual  Eye Contact::  Good  Speech:  Normal Rate  Volume:  Decreased  Mood:  Anxious  Affect:  Flat  Thought Process:  Coherent  Orientation:  Full (Time, Place, and Person)  Thought Content:  WDL  Suicidal Thoughts:  No  Homicidal Thoughts:  No  Memory:  Immediate;   Fair Recent;   Fair Remote;   Fair  Judgement:  Fair  Insight:  Fair  Psychomotor Activity:  Decreased  Concentration:  Fair  Recall:  [employer] of Knowledge:Fair  Language: Fair  Akathisia:  No  Handed:  Right  AIMS (if indicated):     Assets:  Leisure Time Physical Health Resilience Social Support  ADL's:  Intact  Cognition: Impaired,  Moderate  Sleep:      Treatment Plan Summary: Daily contact with patient to assess and evaluate symptoms and progress in treatment, Medication management and Plan schizoaffective disorder:  -Crisis stabilization -Medication management:  Hold on Haldol at this time, discontinuation of Cogentin 2 mg BID.  Amantadine 100 mg BID to prevent EPS. -Individual counseling  Disposition: Supportive therapy provided about ongoing stressors.  Waylan Boga, [place] 10/10/2015 12:38 PM Patient seen face-to-face for psychiatric evaluation, chart reviewed and case discussed with the physician extender and developed treatment plan. Reviewed the information documented and agree with the treatment plan. Corena Pilgrim, MD

## 2015-10-10 NOTE — Progress Notes (Signed)
[facility]. [facility] by family who reported to ED staff that there was a change in the patients speech. Pt. Was medically cleared. Pt.'s speech and response to writer is delayed, baseline of this pt. Is unknown.  The patient did not report any complaints of pain or discomfort, denies A or VH.  Pt. Was offered a sandwich and drink on arrival to the unit.  It was reported that pt. Started taking Haldol appr. 1 week ago and family is concerned this has affected the pt.'s speech.  Haldol scheduled tonight was held until MD assesses the pt. In the am. Pt. Is presently resting quietly and remains safe on the unit.

## 2015-10-10 NOTE — ED Notes (Signed)
Bed: [id number] Expected date:  Expected time:  Means of arrival:  Comments: Hold for 17

## 2015-10-10 NOTE — Progress Notes (Signed)
ED CM confirmed with the [facility] pt seen by NP Maxcine Hameborah Smothers

## 2015-10-10 NOTE — BH Assessment (Addendum)
Tele Assessment Note   Gerald Bridges is an 24 y.o. male [facility]. PT stated "I don't know". "My mom is concern about my breathing, it's deeper". Pt denies SI, HI and AVH at this time. Pt did not report any alcohol or illicit substance abuse. Pt reported that he is [facility]. PT also reported that he was [facility]. Pt denied having a legal guardian; however his mother refused to leave the room during the assessment.  Gathered collateral information from pt's sister and mother who reported that they brought pt into the ED due to believing that he was having a stroke. They reported that recently pt medication was changed to add "2 pills of Haldol". Pt mother reported that pt is prescribed Haldol injections, 2 pills of Haldon and 2 pills of Cogentin; however she will contact [facility] on tomorrow due to the side effects.   Inpatient treatment is recommended for medication adjustment.   Diagnosis: Schizophrenia   Past Medical History:  Past Medical History  Diagnosis Date  . Bipolar disorder (H[facility])   . Schizo affective schizophrenia (H[facility])     History reviewed. No pertinent past surgical history.  Family History:  Family History  Problem Relation Age of Onset  . Diabetes Mother     Social History:  reports that he has never smoked. He does not have any smokeless tobacco history on file. He reports that he does not drink alcohol or use illicit drugs.  Additional Social History:  Alcohol / Drug Use History of alcohol / drug use?: No history of alcohol / drug abuse  CIWA: CIWA-Ar BP: 132/88 mmHg Pulse Rate: 91 COWS:    PATIENT STRENGTHS: (choose at least two) Average or above average intelligence Supportive family/friends  Allergies:  Allergies  Allergen Reactions  . Dexmethylphenidate Hcl     Violent.  . Methylphenidate Derivatives     Violent.    Home Medications:  (Not in  a hospital admission)  OB/GYN Status:  No LMP for male patient.  General Assessment Data Location of Assessment: [facility] TTS Assessment: In system Is this a Tele or Face-to-Face Assessment?: Face-to-Face Is this an Initial Assessment or a Re-assessment for this encounter?: Initial Assessment Marital status: Single Can pt return to current living arrangement?: Yes Admission Status: Voluntary Is patient capable of signing voluntary admission?: Yes Referral Source: Self/Family/Friend Insurance type: Medicaid      Crisis Care Plan [facility]  Name of Therapist: None   Education Status Is patient currently in school?: No Current Grade: N/A Highest grade of school patient has completed: N/A Name of school: N/A Contact person: N/A  Risk to self with the past 6 months Suicidal Ideation: No Has patient been a risk to self within the past 6 months prior to admission? : No Suicidal Intent: No Has patient had any suicidal intent within the past 6 months prior to admission? : No Is patient at risk for suicide?: No Suicidal Plan?: No Has patient had any suicidal plan within the past 6 months prior to admission? : No Access to Means: No What has been your use of drugs/alcohol within the last 12 months?: No alcohol or drug use.  Previous Attempts/Gestures: No How many times?: 0 Other Self Harm Risks: No  Triggers for Past Attempts: None known Intentional Self Injurious Behavior: None Family Suicide History: No Recent stressful life event(s): Other (Comment) (Medication change) Persecutory voices/beliefs?: No Depression:  (  Unable to assess ) Substance abuse history and/or treatment for substance abuse?: No Suicide prevention information given to non-admitted patients: Not applicable  Risk to Others within the past 6 months Homicidal Ideation: No Does patient have any lifetime risk of violence toward others beyond the six months prior to admission? : No Thoughts  of Harm to Others: No Current Homicidal Intent: No Current Homicidal Plan: No Access to Homicidal Means: No Identified Victim: N/A History of harm to others?: No Assessment of Violence: On admission Violent Behavior Description: No violent behaviors observed. Pt is calm and cooperative at this time.  Does patient have access to weapons?: No Criminal Charges Pending?: No Does patient have a court date: No Is patient on probation?: No  Psychosis Hallucinations: None noted Delusions: None noted  Mental Status Report Appearance/Hygiene: Unremarkable Eye Contact: Fair Motor Activity: Unable to assess Speech: Slow Level of Consciousness: Quiet/awake Mood: Euthymic Affect: Blunted Anxiety Level: None Thought Processes: Coherent, Relevant Judgement: Unimpaired Orientation: Appropriate for developmental age Obsessive Compulsive Thoughts/Behaviors: None  Cognitive Functioning Concentration: Decreased Memory: Recent Intact IQ: Average Insight: Good Impulse Control: Good Appetite: Poor Weight Loss: 0 Weight Gain: 0 Sleep: Decreased Total Hours of Sleep: 3 Vegetative Symptoms: Staying in bed  ADLScreening [facility] Assessment Services) Patient's cognitive ability adequate to safely complete daily activities?: Yes Patient able to express need for assistance with ADLs?: Yes Independently performs ADLs?: Yes (appropriate for developmental age)  Prior Inpatient Therapy Prior Inpatient Therapy: Yes Prior Therapy Dates: 2010 Prior Therapy Facilty/Provider(s): [facility]  Reason for Treatment: Schizophrenia   Prior Outpatient Therapy Prior Outpatient Therapy: Yes Prior Therapy Dates: 2010-Current  Prior Therapy Facilty/Provider(s): [facility]  Reason for Treatment: Medication management  Does patient have an ACCT team?: No Does patient have Intensive In-House Services?  : No Does patient have [facility] services? : Yes Does patient have P4CC services?: No  ADL Screening (condition  at time of admission) Patient's cognitive ability adequate to safely complete daily activities?: Yes Patient able to express need for assistance with ADLs?: Yes Independently performs ADLs?: Yes (appropriate for developmental age)       Abuse/Neglect Assessment (Assessment to be complete while patient is alone) Physical Abuse: Denies Verbal Abuse: Denies Sexual Abuse: Denies Exploitation of patient/patient's resources: Denies Self-Neglect: Denies     Merchant navy officer (For Healthcare) Does patient have an advance directive?: No Would patient like information on creating an advanced directive?: No - patient declined information    Additional Information 1:1 In Past 12 Months?: No CIRT Risk: No Elopement Risk: No Does patient have medical clearance?: Yes     Disposition:  Disposition Initial Assessment Completed for this Encounter: Yes Disposition of Patient: Inpatient treatment program Type of inpatient treatment program: Adult  Teresa Lemmerman S 10/10/2015 1:06 AM

## 2015-10-10 NOTE — ED Notes (Signed)
Patient denies SI, HI and AVH at this time. Plan of care discussed. Patient voices no complaints or concerns at this time. Encouragement and support provided and safety maintain. Q 15 min safety checks remain in place  

## 2015-10-10 NOTE — ED Provider Notes (Signed)
Patient has been evaluated by TTS.  He does meet criteria for admission.  He will be moved to TCU to wait  bed assignment  Earley FavorGail Gaylord Seydel, NP 10/10/15 0100  Geoffery Lyonsouglas Delo, MD 10/10/15 [phone]

## 2015-10-10 NOTE — ED Notes (Signed)
Patient has been in his room most of the day.  Is very soft spoken, but no further thought blocking or hesitating with answers.  Mother visited for a while and was updated as to the plan to leave the patient here overnight to ensure he was improved on the change of medication.  She was happy with that and believes he was overmedicated with the Haldol.  She states they used two vials of medication on him.  Patient has been cooperative.

## 2015-10-11 DIAGNOSIS — F251 Schizoaffective disorder, depressive type: Secondary | ICD-10-CM

## 2015-10-11 MED ORDER — HALOPERIDOL 1 MG PO TABS: 1.0000 mg | ORAL_TABLET | Freq: Two times a day (BID) | ORAL | Status: DC

## 2015-10-11 MED ORDER — AMANTADINE HCL 100 MG PO CAPS: 100.0000 mg | ORAL_CAPSULE | Freq: Two times a day (BID) | ORAL | Status: DC

## 2015-10-11 NOTE — BH Assessment (Addendum)
Per Gerald [name], NP patient is psychiatrically cleared and stable for discharge. [facility].

## 2015-10-11 NOTE — ED Notes (Signed)
Patients mother is here and confirmed that he is at his baseline.  He is alert and oriented.  Denies SI, HI, and AVH.  NP saw patient to confirm with mother.

## 2015-10-11 NOTE — Consult Note (Signed)
Psychiatric Specialty Exam: Physical Exam  ROS  Blood pressure 118/62, pulse 95, temperature 98.1 F (36.7 C), temperature source Oral, resp. rate 16, SpO2 98 %.There is no height or weight on file to calculate BMI.  General Appearance: Casual and Fairly Groomed  Patent attorneyye Contact::  Good  Speech:  Clear and Coherent and Slow  Volume:  Decreased  Mood:  Euthymic  Affect:  Congruent  Thought Process:  Coherent and Goal Directed  Orientation:  Full (Time, Place, and Person)  Thought Content:  WDL  Suicidal Thoughts:  No  Homicidal Thoughts:  No  Memory:  Immediate;   Fair Recent;   Fair Remote;   Fair  Judgement:  Fair  Insight:  Fair  Psychomotor Activity:  Normal  Concentration:  Fair  Recall:  [employer]  Fund of Knowledge:  Fair  Language:  Fair  Akathisia:  No  Handed:  Right  AIMS (if indicated):     Assets:  Desire for Improvement  ADL's:  Intact  Cognition:  Impaired,  Mild  Sleep:      Patient was seen by providers this am who was fully  engaged during the assessment.   Patient has improved a lot after receiving his Haldol Decanoate and oral Haldol.  Patient is ambulating well and is able to initiate a conversation.  He is sleeping and eating well.  He ambulates from his room to the bathroom without difficulty.  Mother came in for a visit states that patient is at base line and doing well.  Patient denies SI/HI/AVH.  Patient is discharged home.  Schizoaffective disorder (HCC)   Plan:  Discharge home.  Dahlia ByesJosephine Onuoha  PMHNP-BC Patient seen face-to-face for psychiatric evaluation, chart reviewed and case discussed with the physician extender and developed treatment plan. Reviewed the information documented and agree with the treatment plan. Thedore MinsMojeed Circe Chilton, MD

## 2015-10-11 NOTE — [facility] Suicide Risk Assessment (Cosign Needed)
Suicide Risk Assessment  Discharge Assessment   [facility]   Demographic Factors:  Male, Adolescent or young adult and Unemployed  Total Time spent with patient: 20 minutes  Musculoskeletal: Strength & Muscle Tone: within normal limits Gait & Station: normal Patient leans: N/A  Psychiatric Specialty Exam:     Blood pressure 118/62, pulse 95, temperature 98.1 F (36.7 C), temperature source Oral, resp. rate 16, SpO2 98 %.There is no height or weight on file to calculate BMI.  General Appearance: Casual and Fairly Groomed  Patent attorneyye Contact::  Good  Speech:  Clear and Coherent and Slow  Volume:  Decreased  Mood:  Euthymic  Affect:  Congruent  Thought Process:  Coherent and Goal Directed  Orientation:  Full (Time, Place, and Person)  Thought Content:  WDL  Suicidal Thoughts:  No  Homicidal Thoughts:  No  Memory:  Immediate;   Fair Recent;   Fair Remote;   Fair  Judgement:  Fair  Insight:  Fair  Psychomotor Activity:  Normal  Concentration:  Fair  Recall:  [employer]  Fund of Knowledge:  Fair  Language:  Fair  Akathisia:  No  Handed:  Right  AIMS (if indicated):     Assets:  Desire for Improvement  ADL's:  Intact  Cognition:  Impaired,  Mild        Has this patient used any form of tobacco in the last 30 days? (Cigarettes, Smokeless Tobacco, Cigars, and/or Pipes) N/A  Mental Status Per Nursing Assessment::   On Admission:     Current Mental Status by Physician: NA  Loss Factors: NA  Historical Factors: NA  Risk Reduction Factors:   Living with another person, especially a relative  Continued Clinical Symptoms:  Depression:   Insomnia Schizophrenia:   Depressive state  Cognitive Features That Contribute To Risk:  Polarized thinking    Suicide Risk:  Minimal: No identifiable suicidal ideation.  Patients presenting with no risk factors but with morbid ruminations; may be classified as minimal risk based on the severity of the  depressive symptoms  Principal Problem: Schizoaffective disorder [facility](HCC) Discharge Diagnoses:  Patient Active Problem List   Diagnosis Date Noted  . Schizoaffective disorder (HCC) [F25.9] 10/10/2015    Priority: Medium    Follow-up Information    Follow up with Dentist.   Why:  As soon as possible      Follow up with [facility]   Specialty:  Emergency Medicine   Why:  As needed, If symptoms worsen   Contact information:   [address] [id number] [place] [id number] [phone]      Follow up with See PCP resource guide.   Why:  As needed      Plan Of Care/Follow-up recommendations:  Activity:  As tolerated Diet:  regular  Is patient on multiple antipsychotic therapies at discharge:  No   Has Patient had three or more failed trials of antipsychotic monotherapy by history:  No  Recommended Plan for Multiple Antipsychotic Therapies: NA  Dahlia ByesONUOHA, Brandalynn Ofallon C   PMHNP-BC 10/11/2015, 12:41 PM

## 2017-01-07 DIAGNOSIS — E669 Obesity, unspecified: Secondary | ICD-10-CM | POA: Insufficient documentation

## 2017-01-07 DIAGNOSIS — Z79899 Other long term (current) drug therapy: Secondary | ICD-10-CM | POA: Insufficient documentation

## 2017-02-04 ENCOUNTER — Encounter: Payer: Medicaid Other | Attending: Family Medicine | Admitting: Registered"

## 2017-02-04 DIAGNOSIS — Z713 Dietary counseling and surveillance: Secondary | ICD-10-CM | POA: Diagnosis not present

## 2017-02-04 DIAGNOSIS — R635 Abnormal weight gain: Secondary | ICD-10-CM | POA: Insufficient documentation

## 2017-02-04 NOTE — Progress Notes (Signed)
Medical Nutrition Therapy:  Appt start time: 0800 end time:  0830.   Assessment:  Primary concerns today: This patient is accompanied in the office by his mother who is his POA. Patient states his A1C is high (5.7). Patient relied on mother to provide much of the information in the visit. Patient's mother is concerned about weight gain over the last year and believes his medication is making him hungry all the time. Mother states he is drinking a lot of soda, about 2 L per day.  Mother reports she is diabetic and has made changes to the food she keeps in the house as well as baking instead of frying.  Preferred Learning Style:   No preference indicated   Learning Readiness:   Contemplating   MEDICATIONS: reviewed   DIETARY INTAKE:  Usual eating pattern includes 2-3 meals and 2-3 snacks per day.  Everyday foods include soda.  24-hr recall:  B ( AM): none OR bologna, bacon OR grits  Snk ( AM): honey bun  L ( PM): sandwich Snk ( PM):  D ( PM): spaghetti OR baked chicken, rice, veggies Snk ( PM): none Beverages: 2 L soda  Usual physical activity: walk around the block, membership to [facility], he likes to play basketball  Estimated energy needs: 1800 calories 200 g carbohydrates 135 g protein 50 g fat  Progress Towards Goal(s):  In progress.   Nutritional Diagnosis:  NI-5.8.2 Excessive carbohydrate intake As related to excessive soda and sweets.  As evidenced by diet recall.    Intervention:  Nutrition Education. Discussed healthy eating and importance of smaller portions of sweets to control blood sugar.  Teaching Method Utilized:  Visual Auditory Hands on  Handouts given during visit include:  101 things to do instead of eating  Barriers to learning/adherence to lifestyle change: learning disability  Demonstrated degree of understanding via:  Teach Back   Monitoring/Evaluation:  Dietary intake, exercise, and body weight in 1 month

## 2017-03-04 ENCOUNTER — Ambulatory Visit: Payer: Self-pay | Admitting: Registered"

## 2017-03-05 ENCOUNTER — Ambulatory Visit: Payer: Self-pay | Admitting: Registered"

## 2017-03-20 ENCOUNTER — Encounter: Payer: Medicaid Other | Attending: Family Medicine | Admitting: Registered"

## 2017-03-20 DIAGNOSIS — R635 Abnormal weight gain: Secondary | ICD-10-CM | POA: Insufficient documentation

## 2017-03-20 DIAGNOSIS — E669 Obesity, unspecified: Secondary | ICD-10-CM

## 2017-03-20 DIAGNOSIS — Z713 Dietary counseling and surveillance: Secondary | ICD-10-CM | POA: Insufficient documentation

## 2017-03-20 NOTE — Progress Notes (Signed)
Medical Nutrition Therapy:  Appt start time: 0930 end time:  0945.   Assessment:  Primary concerns today: This patient is not accompanied in the office by his mother for this follow-up visit. Patient states he is eating smaller portion sizes and not drinking as much soda and juice.  Patient cannot state details, he is poor historian d/t disability. Pt remembered his weight from last visit and is aware he has gained 2 lbs.  Pt participated in coming up with vegetables he enjoys and ways he might use them for snacks.  Pt states he sleeps fine because medication helps with sleep.  Preferred Learning Style:   No preference indicated   Learning Readiness:   Contemplating  MEDICATIONS: reviewed   DIETARY INTAKE:  Usual eating pattern includes 2-3 meals and 2-3 snacks per day. Broccoli, sweet potato  Everyday foods include soda.  24-hr recall:  B ( AM): 1 slice of leftover pizza Snk ( AM): no  L ( PM): $5 meal deal from a fast food place Snk ( PM):  D ( PM): meat, veggie, starch Snk ( PM): none Beverages: water, juice?, no soda yesterday  Usual physical activity: walk around the block, membership to [facility], he likes to play basketball  Estimated energy needs: 1800 calories 200 g carbohydrates 135 g protein 50 g fat  Progress Towards Goal(s):  In progress.   Nutritional Diagnosis:  NI-5.8.2 Excessive carbohydrate intake As related to excessive soda and sweets.  As evidenced by diet recall.    Intervention:  Nutrition Education. Discussed healthy eating and importance of smaller portions of sweets to control blood sugar.   Good work on cutting back on soda!  Think about having broccoli and dip as a snack   and Celery and PB for snacks  Aim to get to [facility] get some basketball games in for more physical activity  Review the veggie tip sheet for ideas to get more vegetables in  Teaching Method Utilized:  Visual Auditory  Handouts given during visit include:  10 Tips -  ways to add more veggie in your diet  Barriers to learning/adherence to lifestyle change: developmentally delayed  Demonstrated degree of understanding via:  Teach back   Monitoring/Evaluation:  Dietary intake, exercise, and body weight in 1 month

## 2017-03-20 NOTE — Patient Instructions (Addendum)
Good work on cutting back on soda! Think about having broccoli and dip as a snack  and Celery and PB for snacks Aim to get to [facility] get some basketball games in for more physical activity Review the veggie tip sheet for ideas to get more vegetables in

## 2017-09-09 ENCOUNTER — Encounter: Payer: Medicaid Other | Attending: Family Medicine | Admitting: Registered"

## 2017-09-09 DIAGNOSIS — R635 Abnormal weight gain: Secondary | ICD-10-CM | POA: Insufficient documentation

## 2017-09-09 DIAGNOSIS — E669 Obesity, unspecified: Secondary | ICD-10-CM | POA: Insufficient documentation

## 2017-09-09 NOTE — Patient Instructions (Addendum)
Aim for 1/2 your plate veggies  Some veggie ideas: coleslaw, green beans, cabbage, asparagus Try to eat balanced meals. Keep trying to eat smaller portions Think about eating fruit instead of drinking juice

## 2017-09-09 NOTE — Progress Notes (Signed)
Medical Nutrition Therapy:  Appt start time: 0800 end time:  8030.  Assessment:  Primary concerns today: This patient is accompanied in the office by his mother for this follow-up visit. Per patient's mother, patient wanted to meet with me without his mother present. Patient states he is still eating smaller portion sizes. Pt states he won't eat broccoli & carrots, but said he likes cabbage, coleslaw and green beans. Pt states he drinks juice and when RD asked if he would consider eating fruit instead of drinking juice, he said "I can respect that." Patient appeared to dig in his heels when asked about finding other things to drink besides soda.  Met with patient's mother separately. Patient's mother stated that she and the patient had to catch the bus at 7 am for their 8 am appt with me. Pt's mother states patient is drinking less soda than when they came in last. Pt's mother states she does not keep soda in the house and discourages him from using his money to buy soda with one of the ways she tries to motivate is to tell him he will get diabetes. Mother states she tries to set a good example and has made a new rule in the house that they have to eat in the kitchen and not in his room. When patient was given food models he was very excited about the bread with peanut butter and told me he wished that it was real food. Pt states he really likes PB & jelly sandwiches, but mother states she doesn't give it to him because he will go through the whole jars peanut butter and jelly in a short amount of time.  Pt asked to be weighed. Current weight is 281.2 lb, ~6 lb weight gain in 5 months since last appointment.  [facility] Pt's mother wants him to do other activities besides basketball, and tries to get him to go swimming.  Preferred Learning Style:   No preference indicated   Learning Readiness:   Contemplating  MEDICATIONS: reviewed   DIETARY INTAKE:   Usual eating pattern  includes 2-3 meals and 2-3 snacks per day.   Everyday foods include soda.  24-hr recall: (per patient and patient's mother) B ( AM): boiled eggs, grits Snk ( AM): no  L ( PM): $1 10-piece chicken nugget at [employer] ( PM):  D ( PM): [employer], macaroni, Snk ( PM): none Beverages: water- just depends when and where, juice, likes soda  Usual physical activity: walks in neighborhood, goes to [facility], he likes to play basketball  Estimated energy needs: 1800 calories 200 g carbohydrates 135 g protein 50 g fat  Progress Towards Goal(s):  Some progress.   Nutritional Diagnosis:  NI-5.8.2 Excessive carbohydrate intake As related to excessive soda and sweets.  As evidenced by diet recall.    Intervention:  Nutrition Education. Discussed healthy eating and importance of eating more veggies for health.   Aim for 1/2 your plate veggies   Some veggie ideas: coleslaw, green beans, cabbage, asparagus  Try to eat balanced meals.  Keep trying to eat smaller portions  Think about eating fruit instead of drinking juice  Teaching Method Utilized:  Visual Auditory Hands On  Handouts given during visit include:  Practice sheet for creating balanced meals, 4-5 carb choices  Barriers to learning/adherence to lifestyle change: developmentally delayed, medication side effect of increased appetite  Demonstrated degree of understanding via:  Teach back   Monitoring/Evaluation:  Dietary intake, exercise, and body weight in 1 month

## 2017-10-07 ENCOUNTER — Encounter: Payer: Medicaid Other | Attending: Family Medicine | Admitting: Registered"

## 2017-10-07 DIAGNOSIS — R635 Abnormal weight gain: Secondary | ICD-10-CM | POA: Diagnosis not present

## 2017-10-07 DIAGNOSIS — E669 Obesity, unspecified: Secondary | ICD-10-CM

## 2017-10-07 NOTE — Progress Notes (Signed)
Medical Nutrition Therapy:  Appt start time: 0800 end time:  8030.  Follow-up Assessment:  Primary concerns today: This patient is accompanied in the office by his mother for this follow-up visit. Per patient's mother, A1c 5.8% last week, and she has been trying to get him to drink less soda along with his doctor and other health care team members. RD met with Patient and mother separately. Patient's mother also states that the medications need to be updated and she will bring the most current list to the next appointment.  Pt states he has been eating cabbage, coleslaw and green beans. Pt mother states  When using the food models, patient indicated that he likes beets. Pt states she does not make coleslaw because it has mayonnaise and he will eat a whole bottle of mayonnaise.   Patient states [facility]. Patient's mother wants him to lift weights and states his brother can be his workout buddy.  Mother states she continues to employ the rule in the house that they have to eat in the kitchen and not in his room.   Preferred Learning Style:   No preference indicated   Learning Readiness:   Contemplating  MEDICATIONS: reviewed   DIETARY INTAKE:   Usual eating pattern includes 2-3 meals and 2-3 snacks per day.   Everyday foods include soda.  24-hr recall: (per patient and patient's mother) B ( AM): scrambled eggs, sometimes grits Snk ( AM): no  L ( PM): hotdogs OR PB&J sandwich, juice Snk ( PM):  D ( PM): chicken,  Snk ( PM): none Beverages: 2-3 bottles water- just depends when and where, juice, likes soda  Usual physical activity: walks in neighborhood, goes to [facility], he likes to play basketball sometimes  Estimated energy needs: 1800 calories 200 g carbohydrates 135 g protein 50 g fat  Progress Towards Goal(s):  Some progress.   Nutritional Diagnosis:  NI-5.8.2 Excessive carbohydrate intake As related to excessive soda and sweets.  As evidenced by  diet recall.    Intervention:  Nutrition Education. Discussed healthy eating and importance of eating more veggies for health. Reinforced message of having a good example of healthy eating in the house and encouraging behaviors, not focusing on weight loss. Discussed value of using condiments such as mayonnaise to increase vegetable consumption.   Aim for 1/2 your plate veggies   Some veggie ideas: coleslaw, green beans, cabbage, beets  Try to eat balanced meals.  Keep trying to eat smaller portions  Think about eating fruit instead of drinking juice  Teaching Method Utilized:  Visual Auditory Hands On  Handouts given during visit include:  none  Barriers to learning/adherence to lifestyle change: developmentally delayed, medication side effect of increased appetite  Demonstrated degree of understanding via:  Teach back   Monitoring/Evaluation:  Dietary intake, exercise, and body weight in 1 month

## 2017-12-02 ENCOUNTER — Ambulatory Visit: Payer: Self-pay | Admitting: Registered"

## 2018-03-06 ENCOUNTER — Ambulatory Visit: Payer: Self-pay | Admitting: Registered"

## 2018-03-21 ENCOUNTER — Encounter: Payer: Medicaid Other | Attending: Nurse Practitioner | Admitting: Registered"

## 2018-03-21 DIAGNOSIS — E669 Obesity, unspecified: Secondary | ICD-10-CM

## 2018-03-21 DIAGNOSIS — R635 Abnormal weight gain: Secondary | ICD-10-CM | POA: Insufficient documentation

## 2018-03-21 DIAGNOSIS — Z713 Dietary counseling and surveillance: Secondary | ICD-10-CM | POA: Diagnosis not present

## 2018-03-21 NOTE — Progress Notes (Signed)
Medical Nutrition Therapy:  Appt start time: 0915  end time:  0930.  Patient states he mother is not planning on coming into the visit today.  Follow-up Assessment:  Primary concerns today: Patient states he just woke up (seemed to be important to him to let me know). Patient states he did not have breakfast and usually isn't hungry when he wakes up. Patient states he likes to listen to music in the morning.  When RD asked how he is doing with the goal of eating more vegetables, patient states he is eating mostly meat and doesn't eat baked chicken, likes fried chicken. Patient states he likes broccoli with cheese.  When RD asked what he drinks during the day, patient replied that he does not drink alcohol or smoke.  Preferred Learning Style:   No preference indicated   Learning Readiness:   Contemplating  MEDICATIONS: reviewed   DIETARY INTAKE:   24-hr recall:  B ( AM): none Snk ( AM): no  L ( PM): [employer] fried chicken, mashed potatoes, mac&cheese Snk ( PM):  D ( PM): chicken, broccoli Snk ( PM): none Beverages:OJ, sometimes soda, sweet tea at [employer], [place] sweet tea from [employer] 2-3 cans (33 g cho per can) at one time but not every day.  Usual physical activity:   Estimated energy needs: 1800 calories 200 g carbohydrates 135 g protein 50 g fat  Progress Towards Goal(s):  Some progress.   Nutritional Diagnosis:  NI-5.8.2 Excessive carbohydrate intake As related to excessive soda and sweets.  As evidenced by diet recall.    Intervention:  Nutrition Education. Discussed healthy eating and importance of eating more veggies for health. Reinforced message of having a good example of healthy eating in the house and encouraging behaviors, not focusing on weight loss. Discussed value of using condiments such as mayonnaise to increase vegetable consumption.   Keep trying to add more vegetables to your plate  Meat is fine to also eat  For sweet tea, try the sugar-free [place]  sweet tea packets  Teaching Method Utilized:  Visual Auditory Hands On  Handouts given during visit include:  MyPlate  Picture of [place] Tea brand of sugar-free packets  Barriers to learning/adherence to lifestyle change: developmentally delayed, medication side effect of increased appetite  Demonstrated degree of understanding via:  Teach back   Monitoring/Evaluation:  Dietary intake, exercise, and body weight in 1 month

## 2018-03-21 NOTE — Patient Instructions (Signed)
Keep trying to add more vegetables to your plate Meat is fine to also eat For you sweet tea, try the sugar-free [place] sweet tea packets

## 2018-07-14 ENCOUNTER — Other Ambulatory Visit: Payer: Self-pay

## 2018-07-14 ENCOUNTER — Ambulatory Visit: Payer: Medicaid Other | Admitting: Neurology

## 2018-07-14 ENCOUNTER — Encounter: Payer: Self-pay | Admitting: Neurology

## 2018-07-14 VITALS — BP 110/70 | HR 79 | Ht 70.0 in | Wt 263.5 lb

## 2018-07-14 DIAGNOSIS — G2401 Drug induced subacute dyskinesia: Secondary | ICD-10-CM | POA: Diagnosis not present

## 2018-07-14 NOTE — Patient Instructions (Signed)
   Tardive Dyskinesia Tardive dyskinesia is a disorder that causes uncontrollable body movements. It occurs in some people who are taking a neuroleptic medicine or have taken this type of medicine in the past. These medicines block the effects of the brain chemical dopamine. In some cases, tardive dyskinesia starts months or years after taking the medicine. Not everyone who takes a neuroleptic medicine will get tardive dyskinesia. What are the causes? Tardive dyskinesia is caused by changes in your brain associated with taking a neuroleptic medicine. What increases the risk? If you are taking one of these medicines, your risk of tardive dyskinesia may be higher if you:  Are taking an older type of neuroleptic medicine.  Have been taking the medicine for a long time at a high dose.  Are a woman past the age of menopause.  Are older than 60.  Have a history of alcohol or drug abuse.  What are the signs or symptoms? Abnormal, uncontrollable movements are the main symptom of tardive dyskinesia. These types of movements may include:  Grimacing.  Sticking out or twisting your tongue.  Making chewing or sucking sounds.  Making grunting or sighing noises.  Blinking your eyes.  Twisting, swaying, or thrusting your body.  Foot tapping or finger waving.  Rapid movements of your arms or legs.  How is this diagnosed? Your health care provider may suspect that you have tardive dyskinesia if:  You have been taking neuroleptic medicines.  You have uncontrolled abnormal movements.  If you are taking a medicine that can cause tardive dyskinesia, your health care provider may screen you for early signs of the condition. This may include:  Observing your body movements.  Using a specific rating scale called the Abnormal Involuntary Movement Scale (AIMS).  You may also have tests to rule out other conditions that cause abnormal body movements, including:  Parkinson  disease.  Huntington disease.  Stroke.  How is this treated? The best treatment for tardive dyskinesia is to lower the dosage of your medicine or switch to a different medicine at the first sign of abnormal and uncontrolled movements. There is no cure for long-term (chronic) tardive dyskinesia. Some medicines may help control the movements. These include:  Clozapine, a medicine used to treat mental illness (antipsychotic).  Some muscle relaxants.  Some antiseizure medicines.  Some medicines used to treat high blood pressure.  Some tranquilizers (sedatives).  Follow these instructions at home:  Take medicines only as directed by your health care provider. Do not stop or start taking any medicines without talking to your health care provider first.  Do not abuse drugs or alcohol.  Keep all follow-up visits as directed by your health care provider. This is important. Contact a health care provider if:  You are unable to eat or drink.  You have had a fall.  Your symptoms worsen. This information is not intended to replace advice given to you by your health care provider. Make sure you discuss any questions you have with your health care provider. Document Released: 10/26/2002 Document Revised: 07/05/2016 Document Reviewed: 01/08/2014 Elsevier Interactive Patient Education  2018 Elsevier Inc.  

## 2018-07-14 NOTE — Progress Notes (Signed)
Reason for visit: Tardive dyskinesia  Referring physician: Dr. Willa Rough is a 27 y.o. male  History of present illness:  Gerald Bridges is a 27 year old right-handed black male with a history of a schizoaffective disorder, he has been on antipsychotic medications for a number of years.  He was on Haldol up until about 2 or 3 years ago, he was then switched to Gerald Bridges.  The patient had been on Geodon and Cogentin until about a month ago, this was stopped.  For a little bit more than a year, the mother has noticed that the patient will intermittently stick out his tongue, and purse his lips.  The patient indicates that this does not affect his speech or swallowing, he has not bitten his tongue or cheek.  The patient has been followed through [facility] psychiatry.  The patient has not noted any movements involving the shoulders, head or neck, or legs.  He has not had any changes in balance, he denies any numbness or weakness of the extremities, he denies headaches or dizziness or vision changes.  He is sleeping fairly well at night.  The patient is sent to this office for an evaluation.  Past Medical History:  Diagnosis Date  . Bipolar disorder (HCC)   . Schizo affective schizophrenia (HCC)     History reviewed. No pertinent surgical history.  Family History  Problem Relation Age of Onset  . Diabetes Mother   . Healthy Sister   . Healthy Brother   . Healthy Brother   . Healthy Brother     Social history:  reports that he has never smoked. He has never used smokeless tobacco. He reports that he does not drink alcohol or use drugs.  Medications:  Prior to Admission medications   Medication Sig Start Date End Date Taking? Authorizing Provider  gabapentin (NEURONTIN) 300 MG capsule Take 300 mg by mouth daily.   Yes [provider]  paliperidone (INVEGA SUSTENNA) 234 MG/1.5ML SUSY injection Inject 234 mg into the muscle every 30 (thirty) days.   Yes [provider]      Allergies  Allergen Reactions  . Dexmethylphenidate Hcl     Violent.  . Methylphenidate Derivatives     Violent.    ROS:  Out of a complete 14 system review of symptoms, the patient complains only of the following symptoms, and all other reviewed systems are negative.  Skin rash Allergies Depression, anxiety, hallucinations Snoring  Blood pressure 110/70, pulse 79, height 5\' 10"  (1.778 m), weight 263 lb 8 oz (119.5 kg), SpO2 98 %.  Physical Exam  General: The patient is alert and cooperative at the time of the examination.  The patient is moderately obese.  Eyes: Pupils are equal, round, and reactive to light. Discs are flat bilaterally.  Neck: The neck is supple, no carotid bruits are noted.  Respiratory: The respiratory examination is clear.  Cardiovascular: The cardiovascular examination reveals a regular rate and rhythm, no obvious murmurs or rubs are noted.  Skin: Extremities are without significant edema.  Neurologic Exam  Mental status: The patient is alert and oriented x 3 at the time of the examination. The patient has apparent normal recent and remote memory, with an apparently normal attention span and concentration ability.  Cranial nerves: Facial symmetry is present. There is good sensation of the face to pinprick and soft touch bilaterally. The strength of the facial muscles and the muscles to head turning and shoulder shrug are normal bilaterally.  Speech is well enunciated, no aphasia or dysarthria is noted. Extraocular movements are full. Visual fields are full. The tongue is midline, and the patient has symmetric elevation of the soft palate. No obvious hearing deficits are noted.  Occasional events of tongue protrusion and curling the tongue are noted.  This is not a constant feature.  Motor: The motor testing reveals 5 over 5 strength of all 4 extremities. Good symmetric motor tone is noted throughout.  Sensory: Sensory testing is intact to  pinprick, soft touch, vibration sensation, and position sense on all 4 extremities. No evidence of extinction is noted.  Coordination: Cerebellar testing reveals good finger-nose-finger and heel-to-shin bilaterally.  Gait and station: Gait is normal. Tandem gait is normal. Romberg is negative. No drift is seen.  Reflexes: Deep tendon reflexes are symmetric and normal bilaterally. Toes are downgoing bilaterally.   Assessment/Plan:  1.  Tardive dyskinesia  The patient has relatively mild symptoms of tardive dyskinesia mainly involving tongue protrusion and tongue curling.  I would not recommend medical therapy for this, the mother will observe the patient over time, if the symptoms clearly worsen and become persistent, treatment may be indicated in the future.  He will follow-up through this office if needed.  Marlan Palau. Keith Norva Bowe MD 07/14/2018 10:01 AM  [facility] [address] [place], [place] [id number]  Phone [phone] Fax [phone]

## 2018-07-28 ENCOUNTER — Encounter: Payer: Medicaid Other | Attending: Nurse Practitioner | Admitting: Registered"

## 2018-07-28 DIAGNOSIS — E669 Obesity, unspecified: Secondary | ICD-10-CM | POA: Insufficient documentation

## 2018-07-28 DIAGNOSIS — Z713 Dietary counseling and surveillance: Secondary | ICD-10-CM | POA: Diagnosis not present

## 2018-07-28 NOTE — Patient Instructions (Signed)
Think about having something for breakfast: an idea would be peanut butter on a piece of bread or a PB&J sandwich.  Keep drinking plenty of water. Continue having broccoli, and salad with chicken

## 2018-07-28 NOTE — Progress Notes (Signed)
Medical Nutrition Therapy:  Appt start time: 0815  end time:  0830.  RD contacted patient's mother and she is not planning on coming today, but plans to come with him to appointment next month.   Follow-up Assessment:  Primary concerns today: Pt states changes made since last visit include: drinking more water, walking more around the mall, around the park, takes a break while exercise . Still eating broccoli with cheese started eating more salad.  Patient states he still usually isn't hungry when he wakes up and may just have some OJ or tea. When RD suggested he have bread with PB, patient states he can "respect that" and is open to trying to include something in the morning.  Preferred Learning Style:   No preference indicated   Learning Readiness:   Contemplating  MEDICATIONS: reviewed Pt states they took everthing off except sleep medication.   DIETARY INTAKE:   24-hr recall:  B ( AM): none, OJ juice (likes better than apple juice) Snk ( AM): none L ( PM): spaghetti,  Snk ( PM):  D ( PM): chicken, broccoli Snk ( PM): none Beverages: water, a little bit of soda.  Usual physical activity: walking  Estimated energy needs: 1800 calories 200 g carbohydrates 135 g protein 50 g fat  Progress Towards Goal(s):  Some progress.   Nutritional Diagnosis:  NI-5.8.2 Excessive carbohydrate intake As related to excessive soda and sweets.  As evidenced by diet recall.    Intervention:  Nutrition Education. Importance of breakfast. Discussed getting in more vegetables.  Think about having something for breakfast: an idea would be peanut butter on a piece of bread or a PB&J sandwich.  Keep drinking plenty of water. Continue having broccoli, and salad with chicken  Teaching Method Utilized:  Visual Auditory  Handouts given during visit include:  None  Barriers to learning/adherence to lifestyle change: developmentally delayed, medication side effect of increased appetite (Check  with patient's mother at next visit to confirm medication changes.)  Demonstrated degree of understanding via:  Teach back   Monitoring/Evaluation:  Dietary intake, exercise, and body weight in 1 month

## 2018-08-25 ENCOUNTER — Encounter: Payer: Medicaid Other | Attending: Nurse Practitioner | Admitting: Registered"

## 2018-08-25 DIAGNOSIS — E669 Obesity, unspecified: Secondary | ICD-10-CM | POA: Diagnosis not present

## 2018-08-25 DIAGNOSIS — Z713 Dietary counseling and surveillance: Secondary | ICD-10-CM | POA: Diagnosis not present

## 2018-08-25 NOTE — Patient Instructions (Addendum)
Consider adding water to your juice Try the broccoli & cheese in the microwave bag and include when you have spaghetti for your lunch. Keep oranges, apples out in a bowl on the counter to help you remember to choose them for snacks. Continue going to the park and walking and looking at the wildlife; ducks, pigeons, other birds. Consider looking into the park activities: https://www.[place].gov/departments/parks-recreation/recreation-centers

## 2018-08-25 NOTE — Progress Notes (Signed)
Medical Nutrition Therapy:  Appt start time: 0955  end time:  0925.  Follow-up Assessment:  Primary concerns today: Pt states changes continues to eat broccoli with cheese, has salad available, but did not choose to eat it yesterday. Per patient's mother she is still working on reducing his soda intake. Patient's mother states he will eat all the fruit if she leaves it out, instead she offers it to him regularly and will give him just one piece of fruit.  Patient states he still usually isn't hungry when he wakes up and may just have some OJ or tea. When RD suggested he have bread with PB, patient states he can "respect that" and is open to trying to include something in the morning.  Pt states he thinks he should eat less meat. The amount patient states he is eating is appropriate, but his mother states he eats a lot more than what he reported. RD believes he is aware of recommended portion size.  Preferred Learning Style:   No preference indicated   Learning Readiness:   Contemplating  MEDICATIONS: reviewed Pt's mother confirmed all except sleep medication was discontinued due to how it was affecting his mouth and tongue.   DIETARY INTAKE:   24-hr recall:  B ( AM): none, OJ juice  Snk ( AM): none L ( PM): spaghetti, beef, Snk ( PM):  D ( PM): chicken, broccoli Snk ( PM): none Beverages: water (mother states he is still drinking soda)  Usual physical activity: walking  Estimated energy needs: 1800 calories 200 g carbohydrates 135 g protein 50 g fat  Progress Towards Goal(s):  Some progress.   Nutritional Diagnosis:  NI-5.8.2 Excessive carbohydrate intake As related to excessive soda and sweets.  As evidenced by diet recall.    Intervention:  Nutrition Education. Importance of breakfast. Discussed getting in more vegetables.  Plan: Consider adding water to your juice Try the broccoli & cheese in the microwave bag and include when you have spaghetti for your lunch. Keep  oranges, apples out in a bowl on the counter to help you remember to choose them for snacks. Continue going to the park and walking and looking at the wildlife; ducks, pigeons, other birds. Consider looking into the park activities: https://www.[place].gov/departments/parks-recreation/recreation-centers  Teaching Method Utilized:  Visual Auditory  Handouts given during visit include:  None  Barriers to learning/adherence to lifestyle change: developmentally delayed, either poor historian to intake per parent, or patient just does not want to share actual intake  Demonstrated degree of understanding via:  Teach back   Monitoring/Evaluation:  Dietary intake, exercise, and body weight in 2 month

## 2018-12-01 ENCOUNTER — Ambulatory Visit: Payer: Self-pay | Admitting: Registered"

## 2018-12-30 ENCOUNTER — Other Ambulatory Visit: Payer: Self-pay

## 2018-12-30 ENCOUNTER — Emergency Department (HOSPITAL_COMMUNITY): Payer: No Typology Code available for payment source

## 2018-12-30 ENCOUNTER — Emergency Department (HOSPITAL_COMMUNITY)
Admission: EM | Admit: 2018-12-30 | Discharge: 2018-12-30 | Disposition: A | Discharge status: Home / Self Care | Payer: No Typology Code available for payment source | Attending: Emergency Medicine | Admitting: Emergency Medicine

## 2018-12-30 DIAGNOSIS — Y939 Activity, unspecified: Secondary | ICD-10-CM | POA: Diagnosis not present

## 2018-12-30 DIAGNOSIS — Z79899 Other long term (current) drug therapy: Secondary | ICD-10-CM | POA: Insufficient documentation

## 2018-12-30 DIAGNOSIS — S301XXA Contusion of abdominal wall, initial encounter: Secondary | ICD-10-CM | POA: Diagnosis not present

## 2018-12-30 DIAGNOSIS — Y999 Unspecified external cause status: Secondary | ICD-10-CM | POA: Insufficient documentation

## 2018-12-30 DIAGNOSIS — T148XXA Other injury of unspecified body region, initial encounter: Secondary | ICD-10-CM | POA: Insufficient documentation

## 2018-12-30 DIAGNOSIS — S299XXA Unspecified injury of thorax, initial encounter: Secondary | ICD-10-CM | POA: Diagnosis present

## 2018-12-30 DIAGNOSIS — Y92410 Unspecified street and highway as the place of occurrence of the external cause: Secondary | ICD-10-CM | POA: Insufficient documentation
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
## 2018-12-30 NOTE — Discharge Instructions (Addendum)
Your CT scan of your abdomen is normal. Take tylenol and motrin as needed for pain. Return as needed for worsening symptoms.
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
## 2020-02-15 ENCOUNTER — Ambulatory Visit: Payer: Medicaid Other | Attending: Internal Medicine

## 2020-02-15 DIAGNOSIS — Z23 Encounter for immunization: Secondary | ICD-10-CM

## 2020-02-15 NOTE — Progress Notes (Signed)
   Covid-19 Vaccination Clinic  Name:  Gerald Bridges    MRN: [id number] DOB: 10/05/1991  02/15/2020  Mr. Gerald Bridges was observed post Covid-19 immunization for 15 minutes without incident. He was provided with Vaccine Information Sheet and instruction to access the V-Safe system.   Mr. Gerald Bridges was instructed to call 911 with any severe reactions post vaccine: Marland Kitchen Difficulty breathing  . Swelling of face and throat  . A fast heartbeat  . A bad rash all over body  . Dizziness and weakness   Immunizations Administered    Name Date Dose VIS Date Route   Pfizer COVID-19 Vaccine 02/15/2020 10:39 AM 0.3 mL 10/30/2019 Intramuscular   Manufacturer: [employer], [employer]   Lot: [id number]   NDC: [address]-3

## 2020-03-08 ENCOUNTER — Ambulatory Visit: Payer: Medicaid Other | Attending: Internal Medicine

## 2020-03-08 DIAGNOSIS — Z23 Encounter for immunization: Secondary | ICD-10-CM

## 2020-03-08 NOTE — Progress Notes (Signed)
   Covid-19 Vaccination Clinic  Name:  Gerald Bridges    MRN: [id number] DOB: 1991/09/04  03/08/2020  Mr. Gerald Bridges was observed post Covid-19 immunization for 15 minutes without incident. He was provided with Vaccine Information Sheet and instruction to access the V-Safe system.   Mr. Gerald Bridges was instructed to call 911 with any severe reactions post vaccine: Marland Kitchen Difficulty breathing  . Swelling of face and throat  . A fast heartbeat  . A bad rash all over body  . Dizziness and weakness   Immunizations Administered    Name Date Dose VIS Date Route   Pfizer COVID-19 Vaccine 03/08/2020 11:01 AM 0.3 mL 01/13/2019 Intramuscular   Manufacturer: [employer], [employer]   Lot: [id number]   NDC: [address]-9

## 2020-05-09 ENCOUNTER — Encounter (HOSPITAL_COMMUNITY): Payer: Self-pay | Admitting: Pediatrics

## 2020-05-09 ENCOUNTER — Emergency Department (HOSPITAL_COMMUNITY): Payer: Medicaid Other

## 2020-05-09 ENCOUNTER — Other Ambulatory Visit: Payer: Self-pay

## 2020-05-09 ENCOUNTER — Emergency Department (HOSPITAL_COMMUNITY)
Admission: EM | Admit: 2020-05-09 | Discharge: 2020-05-09 | Disposition: A | Discharge status: Home / Self Care | Payer: Medicaid Other | Attending: Emergency Medicine | Admitting: Emergency Medicine

## 2020-05-09 DIAGNOSIS — F250 Schizoaffective disorder, bipolar type: Secondary | ICD-10-CM | POA: Diagnosis not present

## 2020-05-09 DIAGNOSIS — Z23 Encounter for immunization: Secondary | ICD-10-CM | POA: Diagnosis not present

## 2020-05-09 DIAGNOSIS — M79672 Pain in left foot: Secondary | ICD-10-CM | POA: Insufficient documentation

## 2020-05-09 DIAGNOSIS — Z79899 Other long term (current) drug therapy: Secondary | ICD-10-CM | POA: Diagnosis not present

## 2020-05-09 MED ORDER — TETANUS-DIPHTH-ACELL PERTUSSIS 5-2.5-18.5 LF-MCG/0.5 IM SUSP
0.5000 mL | Freq: Once | INTRAMUSCULAR | Status: AC
  Administered 2020-05-09: 0.5 mL via INTRAMUSCULAR
  Filled 2020-05-09: qty 0.5
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
## 2020-05-09 NOTE — ED Triage Notes (Signed)
Patient c/o foot pain on right side from a glass; stated started 2 days ago. Stated there is a small piece that needs to come out.

## 2020-05-09 NOTE — ED Provider Notes (Signed)
[facility] EMERGENCY DEPARTMENT Provider Note   CSN: [id number] Arrival date & time: 05/09/20  2992     History Chief Complaint  Patient presents with  . Foot Pain    Gerald Bridges is a 29 y.o. male.  HPI   Patient is a 29 year old male with a history of bipolar disorder, schizoaffective schizophrenia, who presents to the emergency department today complaining of left foot pain.  He is accompanied by his mother who assists with the history.  Patient states that 3 days ago he started having pain to the left lateral aspect of his foot.  States that he stepped on some glass and since then has had pain.  It is worse with palpation and when he ambulates.  There have been no other symptoms.  Past Medical History:  Diagnosis Date  . Bipolar disorder ([place])   . Schizo affective schizophrenia [facility])     Patient Active Problem List   Diagnosis Date Noted  . Nutritional counseling 07/28/2018  . Simple obesity 09/09/2017  . Schizoaffective disorder ([place]) 10/10/2015    History reviewed. No pertinent surgical history.     Family History  Problem Relation Age of Onset  . Diabetes Mother   . Healthy Sister   . Healthy Brother   . Healthy Brother   . Healthy Brother     Social History   Tobacco Use  . Smoking status: Never Smoker  . Smokeless tobacco: Never Used  Vaping Use  . Vaping Use: Never used  Substance Use Topics  . Alcohol use: No  . Drug use: No    Home Medications Prior to Admission medications   Medication Sig Start Date End Date Taking? Authorizing Provider  gabapentin (NEURONTIN) 300 MG capsule Take 300 mg by mouth daily.    [provider]  paliperidone (INVEGA SUSTENNA) 234 MG/1.5ML SUSY injection Inject 234 mg into the muscle every 30 (thirty) days.    [provider]    Allergies    Dexmethylphenidate hcl and Methylphenidate derivatives  Review of Systems   Review of Systems  Constitutional: Negative for  fever.  Musculoskeletal:       Left foot pain    Physical Exam Updated Vital Signs BP 128/80 (BP Location: Left Arm)   Pulse 89   Temp 97.8 F (36.6 C) (Oral)   Resp 16   Ht 5\' 10"  (1.778 m)   Wt (!) 140.6 kg   SpO2 95%   BMI 44.48 kg/m   Physical Exam Constitutional:      General: He is not in acute distress.    Appearance: He is well-developed.  Eyes:     Conjunctiva/sclera: Conjunctivae normal.  Cardiovascular:     Rate and Rhythm: Normal rate.  Pulmonary:     Effort: Pulmonary effort is normal.  Musculoskeletal:     Comments: Left lateral foot TTP with no obvious overlying skin changes or open wounds  Skin:    General: Skin is warm and dry.  Neurological:     Mental Status: He is alert and oriented to person, place, and time.     ED Results / Procedures / Treatments   Labs (all labs ordered are listed, but only abnormal results are displayed) Labs Reviewed - No data to display  EKG None  Radiology DG Foot 2 Views Left  Result Date: 05/09/2020 CLINICAL DATA:  Stepped on glass 3 days ago.  Laceration on heel. EXAM: LEFT FOOT - 2 VIEW COMPARISON:  None. FINDINGS: Negative for  fracture. Negative for radiopaque foreign body. Laceration in the plantar surface of the heel is identified. There is degenerative change and  spurring in the midfoot. IMPRESSION: Negative for fracture or foreign body. Electronically Signed   By: Marlan Palau M.D.   On: 05/09/2020 08:37    Procedures Procedures (including critical care time)  Medications Ordered in ED Medications  Tdap (BOOSTRIX) injection 0.5 mL (has no administration in time range)    ED Course  I have reviewed the triage vital signs and the nursing notes.  Pertinent labs & imaging results that were available during my care of the patient were reviewed by me and considered in my medical decision making (see chart for details).    MDM Rules/Calculators/A&P                          29 year old male  presenting for evaluation of left foot pain stating that he stepped on glass 3 days ago and thinks he has a piece of glass stuck in his foot.  X-ray reviewed and there is no evidence of radiopaque foreign body.  Discussed with patient and mother at bedside that glass would not away show up on x-ray and there is a small possibility that he has some glass in his foot but at this time feel that making an incision for exploration would do more harm than good.  We will place him on antibiotics he will prevent infection and have him follow-up with orthopedics.  Wound care discussed.  Will give postop shoe.  Have advised on return precautions.  They voiced understanding of the plan reasons to return.  All questions answered.  Patient stable for discharge.  Final Clinical Impression(s) / ED Diagnoses Final diagnoses:  Foot pain, left    Rx / DC Orders ED Discharge Orders    None       Rayne Du 05/09/20 1238    Tegeler, Canary Brim, MD 05/09/20 [phone]

## 2020-05-09 NOTE — Discharge Instructions (Signed)

## 2020-05-27 ENCOUNTER — Other Ambulatory Visit: Payer: Self-pay

## 2020-05-27 ENCOUNTER — Encounter: Payer: Self-pay | Admitting: Podiatry

## 2020-05-27 ENCOUNTER — Ambulatory Visit (INDEPENDENT_AMBULATORY_CARE_PROVIDER_SITE_OTHER): Payer: Medicaid Other | Admitting: Podiatry

## 2020-05-27 ENCOUNTER — Other Ambulatory Visit: Payer: Self-pay | Admitting: Podiatry

## 2020-05-27 ENCOUNTER — Ambulatory Visit (INDEPENDENT_AMBULATORY_CARE_PROVIDER_SITE_OTHER): Payer: Medicaid Other

## 2020-05-27 VITALS — BP 125/84 | HR 88 | Temp 98.1°F | Resp 16

## 2020-05-27 DIAGNOSIS — Z1881 Retained glass fragments: Secondary | ICD-10-CM

## 2020-05-27 DIAGNOSIS — S9032XA Contusion of left foot, initial encounter: Secondary | ICD-10-CM

## 2020-05-27 DIAGNOSIS — M79672 Pain in left foot: Secondary | ICD-10-CM

## 2020-05-27 NOTE — Progress Notes (Signed)
  Subjective:  Patient ID: Gerald Bridges, male    DOB: 1991/02/10,  MRN: [id number]  Chief Complaint  Patient presents with  . Foot Injury    Left foot; bottom of heel (towards lateral side); pt stated, "I stepped on a piece of glass 2 weeks ago; feels sharp when walking"; x2 weeks    29 y.o. male presents with the above complaint. History confirmed with patient.  He is here today with his mother.  He does remember when he stepped on a piece of glass.  Very painful to walk on.  Objective:  Physical Exam: warm, good capillary refill, no trophic changes or ulcerative lesions, normal DP and PT pulses and normal sensory exam. Left Foot: Small punctate puncture wound on the plantar lateral heel, hyperkeratosis redness.  No signs of acute infection.  Radiographs: X-ray of the left foot: No visible foreign body on x-ray, lesion marker is present Assessment:   1. Glass foreign body in skin   2. Contusion of left foot, initial encounter   3. Pain in left foot      Plan:  Patient was evaluated and treated and all questions answered.  -Discussed treatment options with patient and his mother.  They elected to attempt to remove the foreign body in the office.  The overlying callus was debrided with a #15 blade.  No anesthesia was necessary.  A small 3 mm x 1 mm piece of sharp glass was removed from the plantar heel.  No further foreign body was found after this deeper in the wound.  Sterile Band-Aid with triple antibiotic ointment was applied.  The patient had immediate pain relief and said he no longer felt the sharp pain there.  Advised him that he should notify me if he notices any more pain in this area, and we will order ultrasound to evaluate if he has any retained foreign body  Sharl Ma, DPM 05/27/2020     Return if symptoms worsen or fail to improve.

## 2020-08-22 ENCOUNTER — Other Ambulatory Visit: Payer: Self-pay

## 2020-08-22 ENCOUNTER — Encounter: Payer: Self-pay | Admitting: Neurology

## 2020-08-22 ENCOUNTER — Ambulatory Visit (INDEPENDENT_AMBULATORY_CARE_PROVIDER_SITE_OTHER): Payer: Medicaid Other | Admitting: Neurology

## 2020-08-22 VITALS — BP 132/74 | HR 82 | Ht 70.0 in | Wt 299.5 lb

## 2020-08-22 DIAGNOSIS — G4719 Other hypersomnia: Secondary | ICD-10-CM

## 2020-08-22 DIAGNOSIS — G4733 Obstructive sleep apnea (adult) (pediatric): Secondary | ICD-10-CM

## 2020-08-22 DIAGNOSIS — R351 Nocturia: Secondary | ICD-10-CM | POA: Diagnosis not present

## 2020-08-22 NOTE — Patient Instructions (Signed)

## 2020-08-22 NOTE — Progress Notes (Signed)
Subjective:    Patient ID: Gerald Bridges is a 29 y.o. male.  HPI     Huston Foley, MD, PhD [facility] [address], [address] [address] [place], [place] [address]  Dear Boyd Kerbs,   I saw your patient, Gerald Bridges, upon your kind request in my sleep clinic today for initial consultation of his sleep disorder, in particular, concern for underlying obstructive sleep apnea. The patient is accompanied by his mother today.  The patient stood up abruptly 3 times during this visit and twice he left the room reporting that he needed to go to the bathroom but was redirected by his mom to come back inside, he did agreed to wait till after the visit was over and that he could wait to go to the bathroom.  Nevertheless, he had to be redirected several times even during the visit.  Part of the history was initially provided by his mother but she left the room stating that he was an adult and he could do the visit by himself, she reported that she would stand outside the room, which she did.  As you know, [name] is a 29 year old right-handed gentleman with an underlying medical history of ADHD, mood disorder with Dx of schizoaffective disorder, seizure disorder (by chart review), allergies, and obesity with a BMI of over 40, who was previously diagnosed with obstructive sleep apnea.  Sleep study testing was several years ago.  Per mom, sleep study testing was done when he was a teenager is over 10 years ago and he had a CPAP machine over 10 years ago.  He stopped using his machine as he lost weight.  His weight has been fluctuating.  Prior sleep study results are not available for my review today.  I reviewed your office note from 07/13/2020.  His Epworth sleepiness score is 4 out of 24 today, fatigue severity score is 14 out of 63. He has seen Dr. Anne Hahn in our office for tardive dyskinesia. He reports that if he does not go to bed or go to sleep between 9 and 10 or before midnight he will  not sleep and will go to bed the next day.  Per mom, he can sleep into the afternoon but patient reports that he sleeps till 8 or 9 typically.  He lives with his mother.  He has a TV on in his bedroom at night and keeps it on.  He has nocturia about once per average night and denies recurrent morning headaches.  He would be willing to try CPAP therapy again.  He reports that he was able to tolerate it in the past.  His Past Medical History Is Significant For: Past Medical History:  Diagnosis Date  . Bipolar disorder (HCC)   . OSA (obstructive sleep apnea)   . Schizo affective schizophrenia (HCC)     His Past Surgical History Is Significant For: Past Surgical History:  Procedure Laterality Date  . no past surgery      His Family History Is Significant For: Family History  Problem Relation Age of Onset  . Diabetes Mother   . Other Father        unsure of medical history  . Healthy Sister   . Healthy Brother   . Healthy Brother   . Healthy Brother     His Social History Is Significant For: Social History   Socioeconomic History  . Marital status: Single    Spouse name: Not on file  . Number  of children: 0  . Years of education: 57  . Highest education level: High school graduate  Occupational History  . Occupation: Disabled  Tobacco Use  . Smoking status: Never Smoker  . Smokeless tobacco: Never Used  Vaping Use  . Vaping Use: Never used  Substance and Sexual Activity  . Alcohol use: No  . Drug use: No  . Sexual activity: Not on file  Other Topics Concern  . Not on file  Social History Narrative   Lives with mother.   Caffeine use: one soda per day.   Right-handed.   Social Determinants of Health   Financial Resource Strain:   . Difficulty of Paying Living Expenses: Not on file  Food Insecurity:   . Worried About Programme researcher, broadcasting/film/video in the Last Year: Not on file  . Ran Out of Food in the Last Year: Not on file  Transportation Needs:   . Lack of  Transportation (Medical): Not on file  . Lack of Transportation (Non-Medical): Not on file  Physical Activity:   . Days of Exercise per Week: Not on file  . Minutes of Exercise per Session: Not on file  Stress:   . Feeling of Stress : Not on file  Social Connections:   . Frequency of Communication with Friends and Family: Not on file  . Frequency of Social Gatherings with Friends and Family: Not on file  . Attends Religious Services: Not on file  . Active Member of Clubs or Organizations: Not on file  . Attends Banker Meetings: Not on file  . Marital Status: Not on file    His Allergies Are:  Allergies  Allergen Reactions  . Dexmethylphenidate Hcl     Violent.  . Methylphenidate Derivatives     Violent.  . Ritalin [Methylphenidate]     Violent   :   His Current Medications Are:  Outpatient Encounter Medications as of 08/22/2020  Medication Sig  . gabapentin (NEURONTIN) 300 MG capsule Take 300 mg by mouth at bedtime.  . paliperidone (INVEGA SUSTENNA) 234 MG/1.5ML SUSY injection Inject 234 mg into the muscle every 30 (thirty) days.  . [DISCONTINUED] Ziprasidone HCl (GEODON PO) Take 1 tablet by mouth in the morning and at bedtime. He is unsure of his dosage.  . [DISCONTINUED] ibuprofen (ADVIL) 600 MG tablet Take by mouth.  . [DISCONTINUED] UNABLE TO FIND Pt's mother stated "He takes Depo ER - generic"   No facility-administered encounter medications on file as of 08/22/2020.  :  Review of Systems:  Out of a complete 14 point review of systems, all are reviewed and negative with the exception of these symptoms as listed below: Review of Systems  Neurological:       Prior sleep study 5+ years ago. Currently not on CPAP therapy. Epworth Sleepiness Scale 0= would never doze 1= slight chance of dozing 2= moderate chance of dozing 3= high chance of dozing  Sitting and reading: 0 Watching TV: 2 Sitting inactive in a public place (ex. Theater or meeting): 2 As a  passenger in a car for an hour without a break: 0 Lying down to rest in the afternoon: 0 Sitting and talking to someone: 0 Sitting quietly after lunch (no alcohol): 0 In a car, while stopped in traffic: 0 Total: 4    Objective:  Neurological Exam  Physical Exam Physical Examination:   Vitals:   08/22/20 0833  BP: 132/74  Pulse: 82    General Examination: The patient is a  very pleasant 29 y.o. male in no acute distress. He appears well-developed and well-nourished and well groomed.   HEENT: Normocephalic, atraumatic, pupils are equal, round and reactive to light, extraocular tracking is good without limitation to gaze excursion or nystagmus noted. Hearing is grossly intact. Face is symmetric with normal facial animation. Speech is clear with no dysarthria noted. There is no hypophonia. There is no lip, neck/head, jaw or voice tremor. Neck is supple with full range of passive and active motion. There are no carotid bruits on auscultation. Oropharynx exam reveals: mild mouth dryness, adequate to marginal dental hygiene, moderate to significant airway crowding noted, uvula tip not fully visualized and tonsillar size not fully evaluated, Mallampati class III.  Tongue protrudes centrally and palate elevates symmetrically.  He has a mild underbite.   Chest: Clear to auscultation without wheezing, rhonchi or crackles noted.  Heart: S1+S2+0, regular and normal without murmurs, rubs or gallops noted.   Abdomen: Soft, non-tender and non-distended with normal bowel sounds appreciated on auscultation.  Extremities: There is no pitting edema in the distal lower extremities bilaterally.   Skin: Warm and dry without trophic changes noted.   Musculoskeletal: exam reveals no obvious joint deformities, tenderness or joint swelling or erythema.   Neurologically:  Mental status: The patient is awake, alert and oriented to place, situation and circumstance.  His mother provides additional history in  the beginning of the appointment.  Cranial nerves II - XII are as described above under HEENT exam.  Motor exam: Normal bulk, strength and tone is noted. There is no tremor. Fine motor skills and coordination: grossly intact.  Cerebellar testing: No dysmetria or intention tremor. There is no truncal or gait ataxia.  Sensory exam: intact to light touch in the upper and lower extremities.  Gait, station and balance: He stands easily. No veering to one side is noted. No leaning to one side is noted. Posture is age-appropriate and stance is narrow based. Gait shows normal stride length and normal pace. No problems turning are noted.                 Assessment and Plan:  In summary, BLANTON KARDELL is a very pleasant 29 y.o.-year old male with an underlying medical history of ADHD, mood disorder with Dx of schizoaffective disorder, seizure disorder (by chart review), allergies, and obesity with a BMI of over 40, who presents for reevaluation of his prior diagnosis of sleep apnea.  Prior sleep study results are not available for my review today, he had a CPAP machine over 10 years ago.  He would be willing to come back for sleep study and try CPAP therapy again.  I explained the sleep test procedure to him.   I also explained to him the importance of treating sleep apnea and also using CPAP regularly not just for compliance reasons for insurance criteria but also to help with symptoms and reducing risks for complications.   He is willing to return for sleep study.  He did not have any additional questions prior to leaving today.  I plan to see him back after testing.   Thank you very much for allowing me to participate in the care of this nice patient. If I can be of any further assistance to you please do not hesitate to call me at [phone].  Sincerely,   Huston Foley, MD, PhD

## 2020-09-06 ENCOUNTER — Ambulatory Visit (INDEPENDENT_AMBULATORY_CARE_PROVIDER_SITE_OTHER): Payer: Medicaid Other | Admitting: Neurology

## 2020-09-06 DIAGNOSIS — G4733 Obstructive sleep apnea (adult) (pediatric): Secondary | ICD-10-CM | POA: Diagnosis not present

## 2020-09-06 DIAGNOSIS — G4720 Circadian rhythm sleep disorder, unspecified type: Secondary | ICD-10-CM

## 2020-09-06 DIAGNOSIS — G4719 Other hypersomnia: Secondary | ICD-10-CM

## 2020-09-06 DIAGNOSIS — R351 Nocturia: Secondary | ICD-10-CM

## 2020-09-22 ENCOUNTER — Telehealth: Payer: Self-pay | Admitting: Neurology

## 2020-09-22 NOTE — Telephone Encounter (Signed)
I called pt's mom. I advised pt that Dr. Frances Furbish reviewed their sleep study results and found that he had severe sleep apnea and recommends that pt be treated with a cpap. Dr. Frances Furbish recommends that pt return for a repeat sleep study in order to properly titrate the cpap and ensure a good mask fit. Pt's mom is agreeable to returning for a titration study. I advised pt that our sleep lab will file with pt's insurance and call pt to schedule the sleep study when we hear back from the pt's insurance regarding coverage of this sleep study. Pt's mom verbalized understanding of results. Pt's mom had no questions at this time but was encouraged to call back if questions arise.

## 2020-09-22 NOTE — Telephone Encounter (Signed)
-----   Message from Huston Foley, MD sent at 09/22/2020  7:20 AM EDT ----- Patient referred for re-eval of OSA (pt no longer on CPAP) by Zoe Lan, NP, seen by me on 08/22/20, diagnostic PSG on 09/06/20.   Please call and notify the patient or his mom that the recent sleep study showed severe obstructive sleep apnea. I recommend treatment for this in the form of CPAP. This will require a repeat sleep study for proper titration and mask fitting and correct monitoring of the oxygen saturations. Please explain to patient. I have placed an order in the chart. Thanks.  Huston Foley, MD, PhD [facility])

## 2020-09-22 NOTE — Addendum Note (Signed)
Addended by: Huston Foley on: 09/22/2020 07:20 AM   Modules accepted: Orders

## 2020-09-22 NOTE — Procedures (Signed)
PATIENT'S NAME:  Gerald Bridges, Gerald Bridges DOB:      1991-10-27      MR#:    [id number]     DATE OF RECORDING: 09/06/2020 REFERRING M.D.:  Zoe Lan, MD Study Performed:   Baseline Polysomnogram HISTORY: 29 year old man with a history of ADHD, mood disorder with Dx of schizoaffective disorder, seizure disorder (by chart review), allergies, and obesity with a BMI of over 40, who was previously diagnosed with obstructive sleep apnea. He is currently not on CPAP and presents for re-evaluation. The patient endorsed the Epworth Sleepiness Scale at 4 points. The patient's weight 300 pounds with a height of 70 (inches), resulting in a BMI of 42.9 kg/m2. The patient's neck circumference measured 18.5 inches.  CURRENT MEDICATIONS: Neurontin, Invega   PROCEDURE:  This is a multichannel digital polysomnogram utilizing the Somnostar 11.2 system.  Electrodes and sensors were applied and monitored per AASM Specifications.   EEG, EOG, Chin and Limb EMG, were sampled at 200 Hz.  ECG, Snore and Nasal Pressure, Thermal Airflow, Respiratory Effort, CPAP Flow and Pressure, Oximetry was sampled at 50 Hz. Digital video and audio were recorded.      BASELINE STUDY  Lights Out was at 20:50 and Lights On at 05:01.  Total recording time (TRT) was 491.5 minutes, with a total sleep time (TST) of 259.5 minutes.   The patient's sleep latency was 221.5 minutes, which is markedly delayed.  REM latency was 100.5 minutes.  The sleep efficiency was 52.8%, which is reduced.     SLEEP ARCHITECTURE: WASO (Wake after sleep onset) was 10 minutes.  There were 29.5 minutes in Stage N1, 104.5 minutes Stage N2, 72 minutes Stage N3 and 53.5 minutes in Stage REM.  The percentage of Stage N1 was 11.4%, which is increased, Stage N2 was 40.3%, Stage N3 was 27.7%, which is increased, and Stage R (REM sleep) was 20.6%. The arousals were noted as: 42 were spontaneous, 1 were associated with PLMs, 58 were associated with respiratory events.  RESPIRATORY  ANALYSIS:  There were a total of 156 respiratory events:  30 obstructive apneas, 0 central apneas and 1 mixed apneas with a total of 31 apneas and an apnea index (AI) of 7.2 /hour. There were 125 hypopneas with a hypopnea index of 28.9 /hour. The patient also had 0 respiratory event related arousals (RERAs).      The total APNEA/HYPOPNEA INDEX (AHI) was 36.1/hour and the total RESPIRATORY DISTURBANCE INDEX was  36.1 /hour.  56 events occurred in REM sleep and 159 events in NREM. The REM AHI was  62.8 /hour, versus a non-REM AHI of 29.1. The patient spent 95 minutes of total sleep time in the supine position and 165 minutes in non-supine.. The supine AHI was 49.2 versus a non-supine AHI of 28.4.  OXYGEN SATURATION & C02:  The Wake baseline 02 saturation was 93%, with the lowest being 60%. Time spent below 89% saturation equaled 113 minutes.   PERIODIC LIMB MOVEMENTS: The patient had a total of 5 Periodic Limb Movements.  The Periodic Limb Movement (PLM) index was 1.2 and the PLM Arousal index was .2/hour.  Audio and video analysis did not show any abnormal or unusual movements, behaviors, phonations or vocalizations, with the exception of talking and mumbling prior to sleep onset. The patient took no bathroom breaks. Loud snoring was noted. The EKG was in keeping with normal sinus rhythm (NSR).  IMPRESSION:  1. Severe Obstructive Sleep Apnea (OSA) 2. Dysfunctions associated with sleep stages or arousal from sleep  RECOMMENDATIONS:  1. This study demonstrates severe obstructive sleep apnea, with a total AHI of 36.1/hour, REM AHI of 62.8/hour, supine AHI of 49.2/hour and O2 nadir of 60%. Treatment with positive airway pressure in the form of CPAP is recommended. This will require a full night titration study to optimize therapy. Other treatment options will be limited secondary to the severity of his sleep disordered breathing.  Weight loss should be pursued. ENT evaluation and/or consultation with a  maxillofacial surgeon or dentist may be feasible in some instances.    2. Please note that untreated obstructive sleep apnea may carry additional perioperative morbidity. Patients with significant obstructive sleep apnea should receive perioperative PAP therapy and the surgeons and particularly the anesthesiologist should be informed of the diagnosis and the severity of the sleep disordered breathing. 3. This study shows delay in sleep onset and decreased sleep efficiency. These are nonspecific findings and per se do not signify an intrinsic sleep disorder or a cause for the patient's sleep-related symptoms. Causes include (but are not limited to) the first night effect of the sleep study, circadian rhythm disturbances, medication effect or an underlying mood disorder or medical problem.  4. The patient should be cautioned not to drive, work at heights, or operate dangerous or heavy equipment when tired or sleepy. Review and reiteration of good sleep hygiene measures should be pursued with any patient. 5. The patient will be seen in follow-up in the sleep clinic at [facility] for discussion of the test results, symptom and treatment compliance review, further management strategies, etc. The referring provider will be notified of the test results.  I certify that I have reviewed the entire raw data recording prior to the issuance of this report in accordance with the Standards of Accreditation of the American Academy of Sleep Medicine (AASM)  Huston Foley, MD, PhD Diplomat, American Board of Neurology and Sleep Medicine (Neurology and Sleep Medicine)

## 2020-09-22 NOTE — Progress Notes (Signed)
Patient referred for re-eval of OSA (pt no longer on CPAP) by Zoe Lan, NP, seen by me on 08/22/20, diagnostic PSG on 09/06/20.   Please call and notify the patient or his mom that the recent sleep study showed severe obstructive sleep apnea. I recommend treatment for this in the form of CPAP. This will require a repeat sleep study for proper titration and mask fitting and correct monitoring of the oxygen saturations. Please explain to patient. I have placed an order in the chart. Thanks.  Huston Foley, MD, PhD [facility])

## 2020-10-05 ENCOUNTER — Telehealth: Payer: Self-pay | Admitting: Neurology

## 2020-10-05 DIAGNOSIS — G4733 Obstructive sleep apnea (adult) (pediatric): Secondary | ICD-10-CM

## 2020-10-05 NOTE — Telephone Encounter (Signed)
Can we get an order for Auto on this patient?We discussed this in meeting. It would be better for patient than returning to sleep lab for titration.

## 2020-10-06 NOTE — Telephone Encounter (Signed)
Since the patient had difficulty sleeping in the sleep lab, I recommend that we start him on treatment at this time for his sleep apnea in the form of AutoPap therapy.  We can consider bringing him in for a formal titration study if need be in the future.  Please relay to patient, I will order a machine called AutoPap for sleep apnea treatment.  He will have to have an appointment for follow-up in sleep clinic in about 3 months, please arrange follow-up appointment with one of our nurse practitioners.

## 2020-10-07 NOTE — Telephone Encounter (Signed)
I called pt, spoke with pt's mother, per DPR. She is agreeable to pt starting an auto-pap. I discussed PAP therapy requirements including usage > 4 hours per night. DME will be [facility]. I advised her that pt will need to follow up with an NP 31-90 days after starting auto pap and offered to make this appt but pt's mother declined. She will call us when he starts PAP therapy and assured me that she would not forget to call us. Pt's mother verbalized understanding of recommendations and had no further questions or concerns.

## 2021-01-10 ENCOUNTER — Ambulatory Visit: Payer: Medicaid Other | Admitting: Neurology

## 2021-05-04 IMAGING — DX DG FOOT 2V*L*
2 series · 2 of 2 positions shown · non-contrast
Comparison: None.

CLINICAL DATA: Stepped on glass 3 days ago.  Laceration on heel.

EXAM:
LEFT FOOT - 2 VIEW

[x foot ap left]
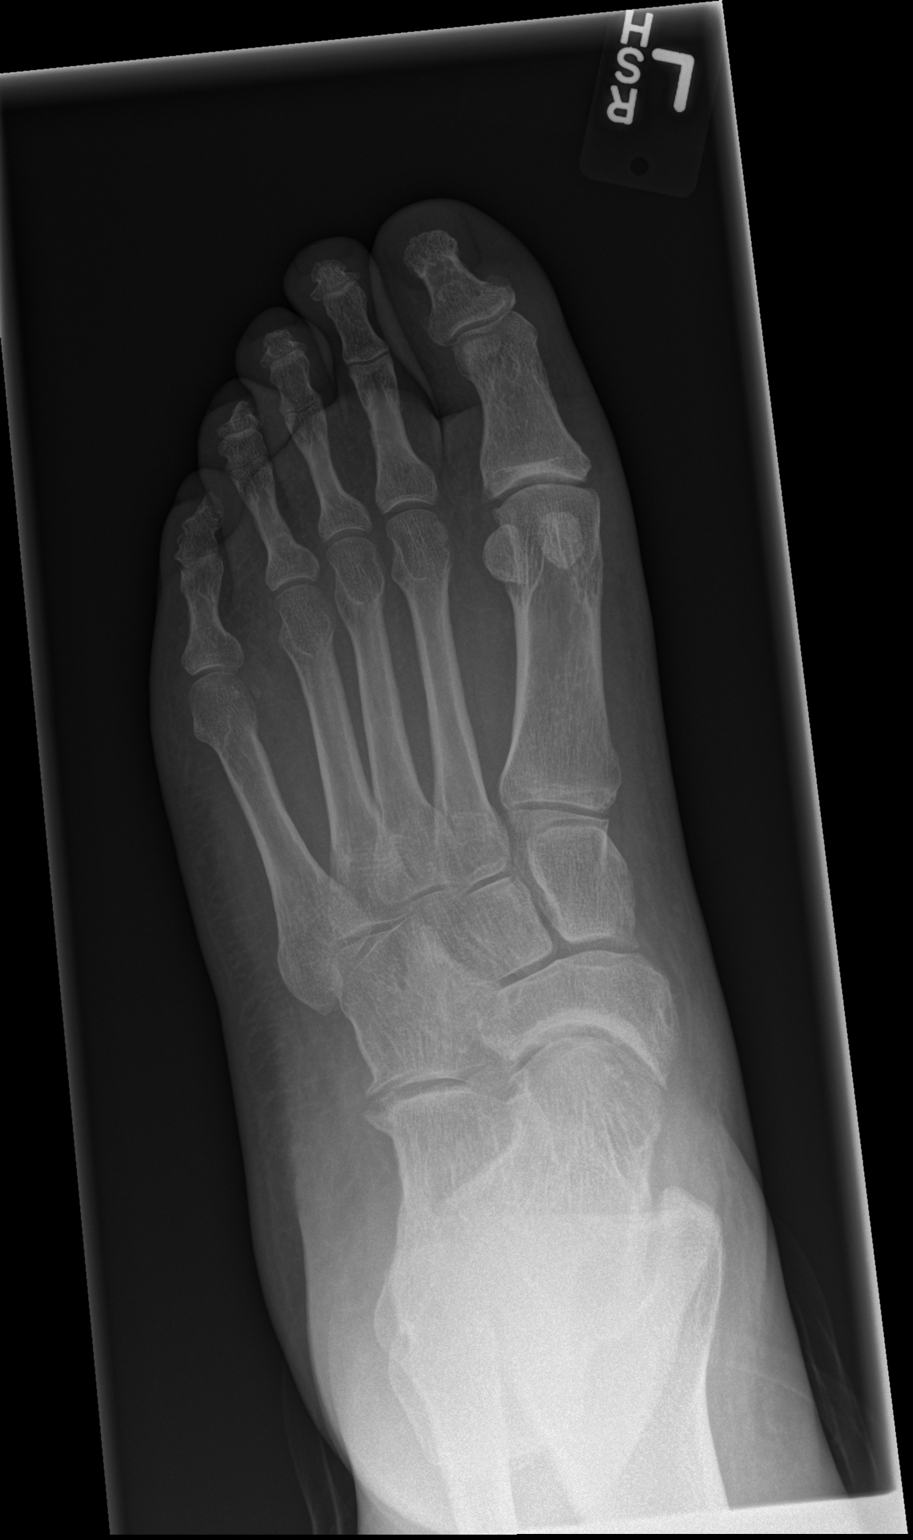

[x foot lat left]
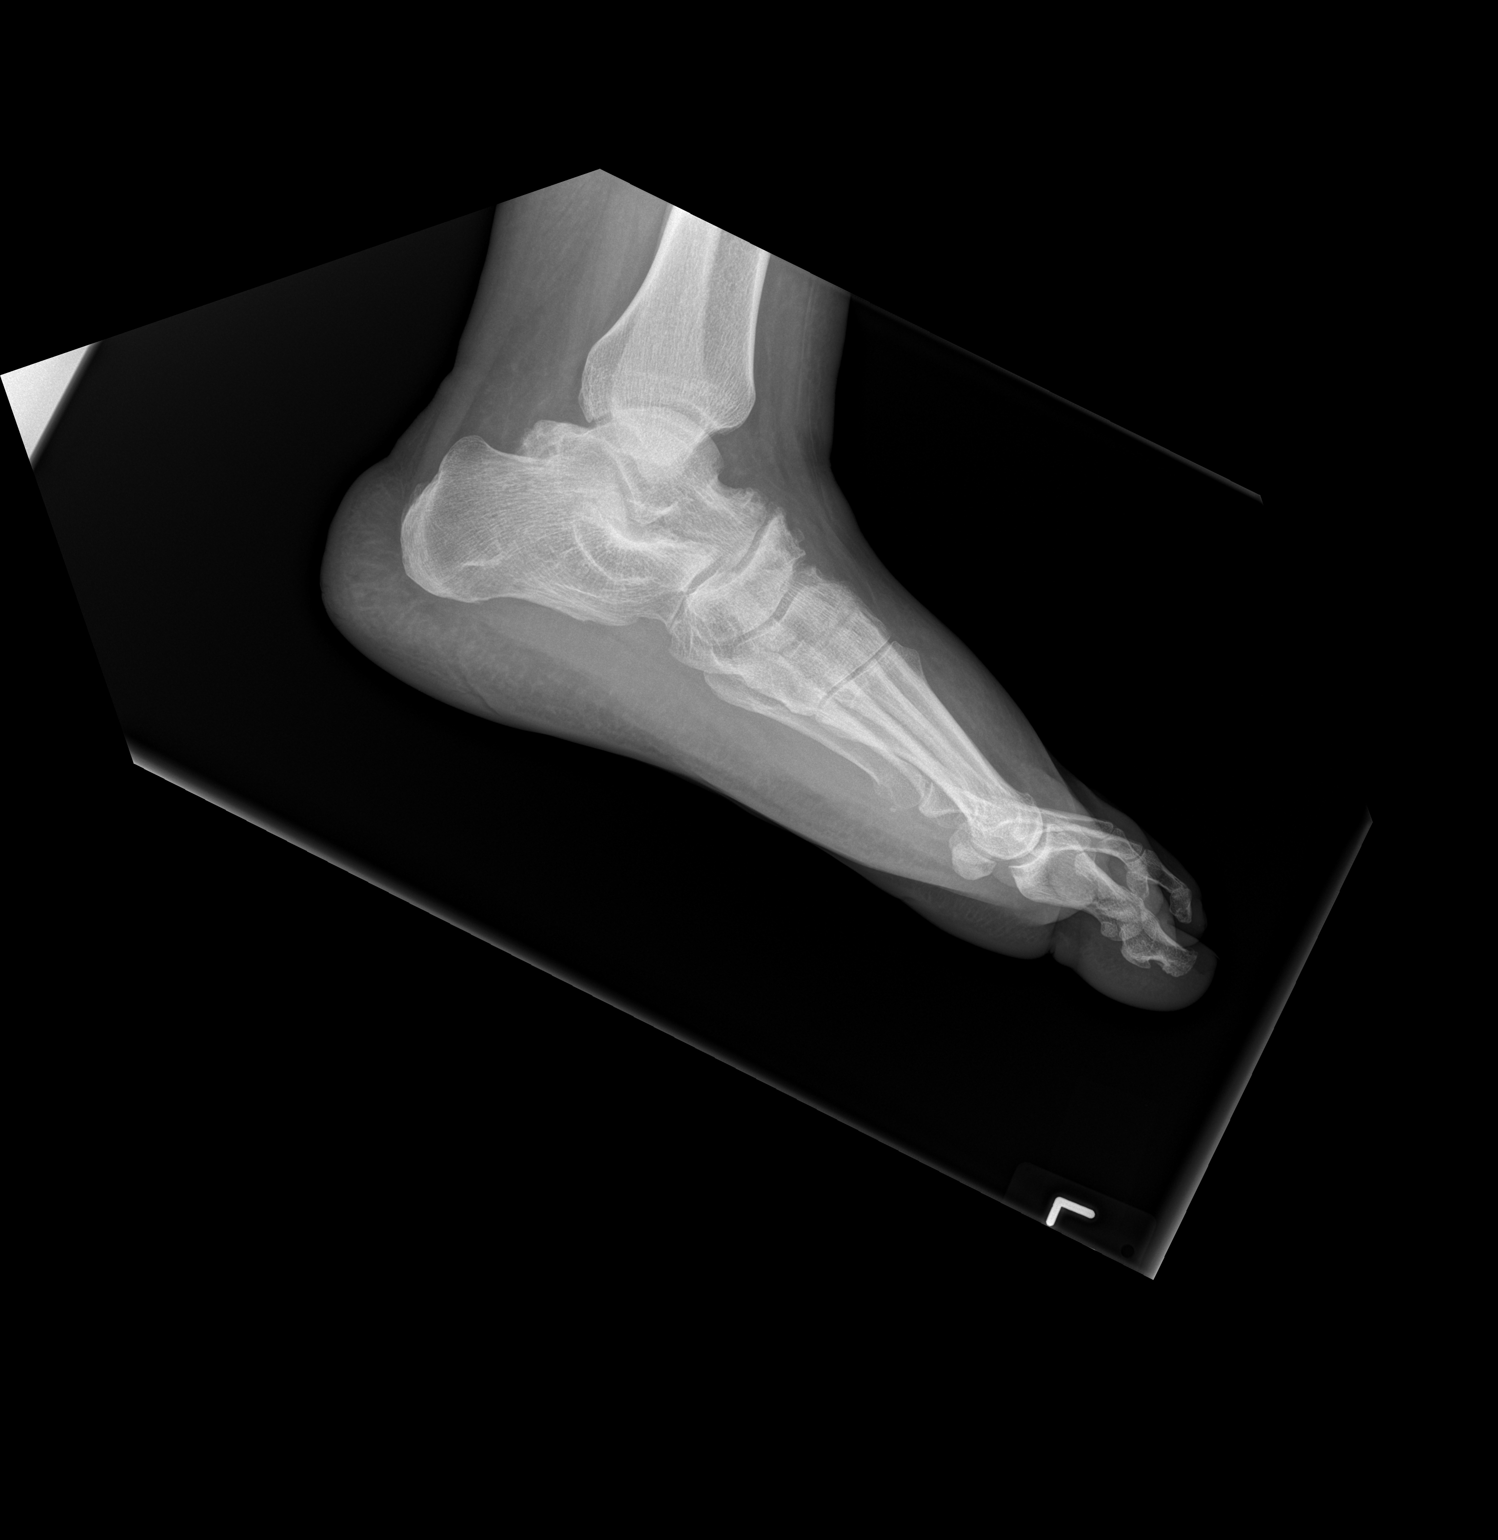

[2 of 2 positions shown; findings below may reference images not displayed]

FINDINGS: Negative for fracture. Negative for radiopaque foreign body.
Laceration in the plantar surface of the heel is identified.

There is degenerative change and  spurring in the midfoot.
IMPRESSION: Negative for fracture or foreign body.

## 2021-06-29 ENCOUNTER — Telehealth: Payer: Self-pay | Admitting: *Deleted

## 2021-06-29 NOTE — Telephone Encounter (Signed)
Received fax from [facility].  Pt's setup date for auto-pap was 06/19/21. Pt will need an initial PAP follow-up within 31-89 days after 06/19/21.

## 2021-09-06 ENCOUNTER — Ambulatory Visit: Payer: Medicaid Other | Admitting: Neurology

## 2021-11-22 ENCOUNTER — Ambulatory Visit: Payer: Medicaid Other | Admitting: Neurology

## 2022-01-08 ENCOUNTER — Telehealth: Payer: Self-pay | Admitting: Neurology

## 2022-01-08 NOTE — Telephone Encounter (Signed)
Called patient back spoke to mother (DPR CHECKED) Did schedule patient with office visit . Mother thanked me for calling

## 2022-01-08 NOTE — Telephone Encounter (Signed)
Pt's mother states the DME came to ome and took CPAP.  Pt's mother is wanting pt back on CPAP and would like a call from RN to discuss getting the CPAP back, please call.

## 2022-01-18 ENCOUNTER — Telehealth: Payer: Self-pay | Admitting: Neurology

## 2022-01-18 ENCOUNTER — Encounter: Payer: Self-pay | Admitting: Neurology

## 2022-01-18 NOTE — Telephone Encounter (Signed)
Dr. Rexene Alberts will be out of the office the week of this patient's appointment. I called to reschedule but there was no answer and his voice mail is full. I mailed a letter with this information. ?

## 2022-01-30 ENCOUNTER — Ambulatory Visit: Payer: Medicaid Other | Admitting: Neurology

## 2022-02-20 ENCOUNTER — Encounter: Payer: Self-pay | Admitting: Neurology

## 2022-02-20 ENCOUNTER — Ambulatory Visit: Payer: Medicaid Other | Admitting: Neurology

## 2022-02-20 VITALS — BP 112/75 | HR 96 | Ht 70.0 in | Wt 289.2 lb

## 2022-02-20 DIAGNOSIS — G4733 Obstructive sleep apnea (adult) (pediatric): Secondary | ICD-10-CM | POA: Diagnosis not present

## 2022-02-20 DIAGNOSIS — R634 Abnormal weight loss: Secondary | ICD-10-CM

## 2022-02-20 NOTE — Patient Instructions (Signed)
Based on your symptoms and your exam I believe you are still at risk for obstructive sleep apnea and would benefit from reevaluation, so you can get a new CPAP or autoPAP machine. We will do a home sleep test, as you have a variable sleep schedule.  ? ?Please remember, the risks and ramifications of moderate to severe obstructive sleep apnea or OSA are: Cardiovascular disease, including congestive heart failure, stroke, difficult to control hypertension, arrhythmias, and even type 2 diabetes has been linked to untreated OSA. Sleep apnea causes disruption of sleep and sleep deprivation in most cases, which, in turn, can cause recurrent headaches, problems with memory, mood, concentration, focus, and vigilance. Most people with untreated sleep apnea report excessive daytime sleepiness, which can affect their ability to drive. Please do not drive if you feel sleepy.  ? ?We will plan a follow up after testing.  ?

## 2022-02-20 NOTE — Progress Notes (Signed)
Subjective:  ?  ?Gerald Bridges ID: Gerald Bridges is a 31 y.o. male. ? ?HPI ? ? ? ?Interim history:  ? ?Gerald Bridges is a 31 year old right-handed gentleman with an underlying medical history of ADHD, mood disorder with Dx of schizoaffective disorder, seizure disorder (by chart review), allergies, and obesity with a BMI of over 40, who presents for reevaluation of his obstructive sleep apnea.  The Gerald Bridges is accompanied by his mother today.  I first met him at the request of his primary care nurse practitioner on 08/22/2020, at which time his mom reported that Gerald Bridges had sleep apnea for years, Gerald Bridges had a CPAP machine several years ago.  Gerald Bridges was advised to proceed with a sleep study.  His baseline sleep study from 09/06/2020 indicated severe obstructive sleep apnea, with a total AHI of 36.1/hour, REM AHI of 62.8/hour, supine AHI of 49.2/hour and O2 nadir of 60%.  Due to difficulty sleeping in the sleep lab, Gerald Bridges was advised to proceed with AutoPap therapy at home.  Gerald Bridges lost coverage for the machine due to noncompliance. ? ?Today, 02/20/2022: Gerald Bridges reports that Gerald Bridges sleeps without a set schedule, mostly during the day, Gerald Bridges does not watch TV in his bed, Gerald Bridges does not drink any caffeine daily but mom reports that if Gerald Bridges has soda available at the house, Gerald Bridges may drink 2 or 3 L.  Gerald Bridges has been working on weight loss and compared to when we first met Gerald Bridges has lost about 10 pounds.  Mom reports that she checks on him in the middle of the night due to his snoring and apneas.  Gerald Bridges lives with mom.  Gerald Bridges does not smoke or drink alcohol.  Gerald Bridges is willing to try CPAP or AutoPap again, would like to have a different mask and a better fit.  Gerald Bridges would like to come in for sleep study but mom reports that it is probably better to try it at home.  We went over his sleep test results and I explained to him that Gerald Bridges did not sleep very well in the sleep lab.  His Epworth sleepiness score is 21 out of 24, fatigue severity score is 57 out of 63.  Mom reports that Gerald Bridges had the CPAP  only a few weeks.  She reports that Gerald Bridges would be willing to try it again and she would try to make sure that Gerald Bridges keeps the mask for sleep consistently. ? ?The Gerald Bridges's allergies, current medications, family history, past medical history, past social history, past surgical history and problem list were reviewed and updated as appropriate.  ? ?Previously:  ? ?08/22/20: (Gerald Bridges) was previously diagnosed with obstructive sleep apnea.  Sleep study testing was several years ago.  Per mom, sleep study testing was done when Gerald Bridges was a teenager is over 10 years ago and Gerald Bridges had a CPAP machine over 10 years ago.  Gerald Bridges stopped using his machine as Gerald Bridges lost weight.  His weight has been fluctuating.  Prior sleep study results are not available for my review today.  I reviewed your office note from 07/13/2020.  His Epworth sleepiness score is 4 out of 24 today, fatigue severity score is 14 out of 63. Gerald Bridges has seen Dr. Jannifer Franklin in our office for tardive dyskinesia. ?Gerald Bridges reports that if Gerald Bridges does not go to bed or go to sleep between 9 and 10 or before midnight Gerald Bridges will not sleep and will go to bed the next day.  Per mom, Gerald Bridges can sleep into the afternoon but Gerald Bridges reports  that Gerald Bridges sleeps till 8 or 9 typically.  Gerald Bridges lives with his mother.  Gerald Bridges has a TV on in his bedroom at night and keeps it on.  Gerald Bridges has nocturia about once per average night and denies recurrent morning headaches.  Gerald Bridges would be willing to try CPAP therapy again.  Gerald Bridges reports that Gerald Bridges was able to tolerate it in the past. ?  ? ?His Past Medical History Is Significant For: ?Past Medical History:  ?Diagnosis Date  ? Bipolar disorder ([place])   ? OSA (obstructive sleep apnea)   ? Schizo affective schizophrenia ([place])   ? ? ?His Past Surgical History Is Significant For: ?Past Surgical History:  ?Procedure Laterality Date  ? no past surgery    ? ? ?His Family History Is Significant For: ?Family History  ?Problem Relation Age of Onset  ? Diabetes Mother   ? Other Father   ?     unsure of medical history  ?  Healthy Sister   ? Healthy Brother   ? Healthy Brother   ? Healthy Brother   ? Sleep apnea Neg Hx   ? ? ?His Social History Is Significant For: ?Social History  ? ?Socioeconomic History  ? Marital status: Single  ?  Spouse name: Not on file  ? Number of children: 0  ? Years of education: 62  ? Highest education level: High school graduate  ?Occupational History  ? Occupation: Disabled  ?Tobacco Use  ? Smoking status: Never  ? Smokeless tobacco: Never  ?Vaping Use  ? Vaping Use: Never used  ?Substance and Sexual Activity  ? Alcohol use: No  ? Drug use: No  ? Sexual activity: Not on file  ?Other Topics Concern  ? Not on file  ?Social History Narrative  ? Lives with mother.  ? Caffeine use: one soda per day.  ? Right-handed.  ? ?Social Determinants of Health  ? ?Financial Resource Strain: Not on file  ?Food Insecurity: Not on file  ?Transportation Needs: Not on file  ?Physical Activity: Not on file  ?Stress: Not on file  ?Social Connections: Not on file  ? ? ?His Allergies Are:  ?Allergies  ?Allergen Reactions  ? Dexmethylphenidate Hcl   ?  Violent.  ? Methylphenidate Derivatives   ?  Violent.  ? Ritalin [Methylphenidate]   ?  Violent ?  ?:  ? ?His Current Medications Are:  ?Outpatient Encounter Medications as of 02/20/2022  ?Medication Sig  ? divalproex (DEPAKOTE ER) 500 MG 24 hr tablet Take 500 mg by mouth at bedtime.  ? gabapentin (NEURONTIN) 300 MG capsule Take 300 mg by mouth at bedtime.  ? paliperidone (INVEGA SUSTENNA) 234 MG/1.5ML SUSY injection Inject 234 mg into the muscle every 30 (thirty) days.  ? ?No facility-administered encounter medications on file as of 02/20/2022.  ?: ? ?Review of Systems:  ?Out of a complete 14 point review of systems, all are reviewed and negative with the exception of these symptoms as listed below: ? ?Review of Systems  ?Neurological:   ?     Pt is here for sleep consult  Pt does have a developmental delay. Pt asked mom to leave the room  Gerald Bridges became agitated . Pt states Gerald Bridges does  snore. Mom states Gerald Bridges has hypertension.Pt denies headaches and fatigue  ? ?ESS:21 ?FSS :57  ? ?Objective:  ?Neurological Exam ? ?Physical Exam ?Physical Examination:  ? ?Vitals:  ? 02/20/22 0737  ?BP: 112/75  ?Pulse: 96  ? ? ?General Examination: The Gerald Bridges is  a very pleasant 31 y.o. male in no acute distress. Gerald Bridges appears well-developed and well-nourished and well groomed.  ? ?HEENT: Normocephalic, atraumatic, pupils are equal, round and reactive to light, extraocular tracking is fairly well-preserved, Gerald Bridges does appear to be restless and gets up to leave the room several times during the visit, Gerald Bridges reports that Gerald Bridges needs to go to the bathroom but is redirected by mom and myself.  Hearing is grossly intact. Face is symmetric with normal facial animation. Speech is clear with no dysarthria noted. There is no hypophonia. There is no lip, neck/head, jaw or voice tremor. Neck is supple with full range of passive and active motion. There are no carotid bruits on auscultation. Oropharynx exam reveals: mild mouth dryness, adequate to marginal dental hygiene, moderate to significant airway crowding noted, uvula tip not fully visualized and tonsillar size not fully evaluated, Mallampati class III.  Tongue protrudes centrally and palate elevates symmetrically.  Gerald Bridges has a mild underbite.  Neck circumference of 20 7/8 inches. ?  ?Chest: Clear to auscultation without wheezing, rhonchi or crackles noted. ?  ?Heart: S1+S2+0, regular and normal without murmurs, rubs or gallops noted.  ?  ?Abdomen: Soft, non-tender and non-distended. ?  ?Extremities: There is no pitting edema in the distal lower extremities bilaterally.  ?  ?Skin: Warm and dry without trophic changes noted.  ?  ?Musculoskeletal: exam reveals no obvious joint deformities.  ?  ?Neurologically:  ?Mental status: The Gerald Bridges is awake, alert and oriented to place, situation and circumstance.  His mother provides major parts of the history.   ?Cranial nerves II - XII are as  described above under HEENT exam.  ?Motor exam: Normal bulk, strength and tone is noted. There is no tremor. Fine motor skills and coordination: grossly intact.  ?Cerebellar testing: No dysmetria or inten

## 2022-05-30 ENCOUNTER — Telehealth: Payer: Self-pay | Admitting: Neurology

## 2022-05-30 NOTE — Telephone Encounter (Signed)
[facility]- [place] [facility] no auth req. Patient is scheduled at [facility] for 06/12/22 at 10 AM. I mailed packet out as well.

## 2022-06-12 ENCOUNTER — Ambulatory Visit: Payer: Medicaid Other | Admitting: Neurology

## 2022-06-12 DIAGNOSIS — R634 Abnormal weight loss: Secondary | ICD-10-CM

## 2022-06-12 DIAGNOSIS — G4733 Obstructive sleep apnea (adult) (pediatric): Secondary | ICD-10-CM

## 2022-06-14 NOTE — Telephone Encounter (Signed)
I spoke with the patient Mother and informed her that we need to r/s due to not enough recording time he only wore it for 4 hours 13 min. His mother stated she will have to call me back and look at their schedule. I gave her my direct number.
